# Patient Record
Sex: Female | Born: 1965 | Race: Black or African American | Hispanic: No | Marital: Single | Smoking: Current every day smoker
Health system: Southern US, Community
[De-identification: ages and names within clinical notes are randomized; demographics above are authoritative.]

## PROBLEM LIST (undated history)

## (undated) DIAGNOSIS — I10 Essential (primary) hypertension: Secondary | ICD-10-CM

## (undated) HISTORY — PX: TOTAL ABDOMINAL HYSTERECTOMY: SHX209

## (undated) HISTORY — PX: OTHER SURGICAL HISTORY: SHX169

## (undated) HISTORY — PX: ABDOMINAL HYSTERECTOMY: SHX81

## (undated) HISTORY — DX: Essential (primary) hypertension: I10

---

## 2016-04-10 NOTE — Congregational Nurse Program (Signed)
Congregational Nurse Program Note  Date of Encounter: 04/10/2016  Past Medical History: No past medical history on file.  Encounter Details:     CNP Questionnaire - 03/17/16 1730      Patient Demographics   Is this a new or existing patient? Existing   Patient is considered a/an Not Applicable   Race African-American/Black     Patient Assistance   Location of Patient Assistance Home of [address], [place]   Patient's financial/insurance status Orange Card/Care Connects   Uninsured Patient (Orange Card/Care Connects) Yes   Interventions Not Applicable   Patient referred to apply for the following financial assistance Not Applicable   Food insecurities addressed Not Applicable   Transportation assistance Yes   Type of Assistance [facility]AT   Assistance securing medications No   Educational health offerings Navigating the healthcare system     Encounter Details   Primary purpose of visit Navigating the Healthcare System;Chronic Illness/Condition Visit   Was an Emergency Department visit averted? Not Applicable   Does patient have a medical provider? No   Patient referred to Not Applicable   Was a mental health screening completed? (GAINS tool) No   Does patient have dental issues? No   Does patient have vision issues? No   Does your patient have an abnormal blood pressure today? No   Since previous encounter, have you referred patient for abnormal blood pressure that resulted in a new diagnosis or medication change? No   Does your patient have an abnormal blood glucose today? No   Since previous encounter, have you referred patient for abnormal blood glucose that resulted in a new diagnosis or medication change? No   Was there a life-saving intervention made? No    03/20/16 tct ADTS spoke with Waynetta Sandy, stated no transportation available on the 30th dt Good  Friday. Client stated she will ask some friends to take her.  Oswaldo Conroy, [place] [facility] [phone]

## 2016-04-10 NOTE — Congregational Nurse Program (Signed)
Congregational Nurse Program Note  Date of Encounter: 04/10/2016  Past Medical History: No past medical history on file.  Encounter Details:     CNP Questionnaire - 03/13/16 1745      Patient Demographics   Is this a new or existing patient? New   Patient is considered a/an Not Applicable   Race African-American/Black     Patient Assistance   Location of Patient Assistance Home of [address], [place]   Patient's financial/insurance status Orange Card/Care Connects   Uninsured Patient (Orange Card/Care Connects) Yes   Interventions Not Applicable   Patient referred to apply for the following financial assistance Medicaid   Food insecurities addressed Not Applicable   Transportation assistance Yes   Type of Network engineer medications No   Educational health offerings Navigating the healthcare system     Encounter Details   Primary purpose of visit Navigating the Healthcare System   Was an Emergency Department visit averted? Not Applicable   Does patient have a medical provider? No   Patient referred to Not Applicable   Was a mental health screening completed? (GAINS tool) No   Does patient have dental issues? No   Does patient have vision issues? No   Does your patient have an abnormal blood pressure today? No   Since previous encounter, have you referred patient for abnormal blood pressure that resulted in a new diagnosis or medication change? No   Does your patient have an abnormal blood glucose today? No   Since previous encounter, have you referred patient for abnormal blood glucose that resulted in a new diagnosis or medication change? No   Was there a life-saving intervention made? No    Has appointment to see [facility] on 04-07-16 Haevyn Ury RN, [place], [phone]  BP 137/92 [phone]

## 2016-04-10 NOTE — Congregational Nurse Program (Signed)
Congregational Nurse Program Note  Date of Encounter: 04/10/2016  Past Medical History: No past medical history on file.  Encounter Details:     CNP Questionnaire - 04/10/16 1730      Patient Demographics   Is this a new or existing patient? Existing   Patient is considered a/an Not Applicable   Race African-American/Black     Patient Assistance   Patient's financial/insurance status Orange Card/Care Connects   Interventions Appt. has been completed   Patient referred to apply for the following financial assistance Not Applicable   Food insecurities addressed Not Applicable   Transportation assistance No   Assistance securing medications No   Educational health offerings Other     Encounter Details   Primary purpose of visit Education/Health Concerns   Was an Emergency Department visit averted? Not Applicable   Does patient have a medical provider? No   Patient referred to Other   Was a mental health screening completed? (GAINS tool) No   Does patient have dental issues? No   Does patient have vision issues? No   Does your patient have an abnormal blood pressure today? No   Since previous encounter, have you referred patient for abnormal blood pressure that resulted in a new diagnosis or medication change? No   Does your patient have an abnormal blood glucose today? No   Since previous encounter, have you referred patient for abnormal blood glucose that resulted in a new diagnosis or medication change? No   Was there a life-saving intervention made? No     Seen at [facility] on 3-[facility]- ,told may have to have surgery to remove pin in left ankle.Office to schedule X-rays. Juliet Rude, [place] [phone]

## 2017-10-22 ENCOUNTER — Other Ambulatory Visit: Payer: Self-pay

## 2017-10-22 ENCOUNTER — Encounter (HOSPITAL_COMMUNITY): Payer: Self-pay

## 2017-10-22 ENCOUNTER — Emergency Department (HOSPITAL_COMMUNITY)
Admission: EM | Admit: 2017-10-22 | Discharge: 2017-10-22 | Disposition: A | Discharge status: Home / Self Care | Payer: Self-pay | Attending: Emergency Medicine | Admitting: Emergency Medicine

## 2017-10-22 ENCOUNTER — Emergency Department (HOSPITAL_COMMUNITY): Payer: Self-pay

## 2017-10-22 DIAGNOSIS — F17210 Nicotine dependence, cigarettes, uncomplicated: Secondary | ICD-10-CM | POA: Insufficient documentation

## 2017-10-22 DIAGNOSIS — M79672 Pain in left foot: Secondary | ICD-10-CM | POA: Insufficient documentation

## 2017-10-22 LAB — CBC WITH DIFFERENTIAL/PLATELET
Abs Immature Granulocytes: 0.01 10*3/uL (ref 0.00–0.07)
BASOS ABS: 0 10*3/uL (ref 0.0–0.1)
Basophils Relative: 1 %
EOS PCT: 3 %
Eosinophils Absolute: 0.2 10*3/uL (ref 0.0–0.5)
HCT: 48.2 % — ABNORMAL HIGH (ref 36.0–46.0)
HEMOGLOBIN: 15.3 g/dL — AB (ref 12.0–15.0)
IMMATURE GRANULOCYTES: 0 %
LYMPHS ABS: 2.9 10*3/uL (ref 0.7–4.0)
LYMPHS PCT: 46 %
MCH: 30.9 pg (ref 26.0–34.0)
MCHC: 31.7 g/dL (ref 30.0–36.0)
MCV: 97.4 fL (ref 80.0–100.0)
Monocytes Absolute: 0.3 10*3/uL (ref 0.1–1.0)
Monocytes Relative: 6 %
NEUTROS PCT: 44 %
NRBC: 0 % (ref 0.0–0.2)
Neutro Abs: 2.6 10*3/uL (ref 1.7–7.7)
Platelets: 256 10*3/uL (ref 150–400)
RBC: 4.95 MIL/uL (ref 3.87–5.11)
RDW: 13.1 % (ref 11.5–15.5)
WBC: 6 10*3/uL (ref 4.0–10.5)

## 2017-10-22 LAB — COMPREHENSIVE METABOLIC PANEL
ALBUMIN: 4.5 g/dL (ref 3.5–5.0)
ALK PHOS: 65 U/L (ref 38–126)
ALT: 17 U/L (ref 0–44)
AST: 20 U/L (ref 15–41)
Anion gap: 9 (ref 5–15)
BUN: 19 mg/dL (ref 6–20)
CALCIUM: 9.3 mg/dL (ref 8.9–10.3)
CO2: 24 mmol/L (ref 22–32)
CREATININE: 0.9 mg/dL (ref 0.44–1.00)
Chloride: 106 mmol/L (ref 98–111)
GFR calc Af Amer: >60 mL/min (ref >60)
GFR calc non Af Amer: >60 mL/min (ref >60)
GLUCOSE: 88 mg/dL (ref 70–99)
Potassium: 4 mmol/L (ref 3.5–5.1)
SODIUM: 139 mmol/L (ref 135–145)
Total Bilirubin: 0.7 mg/dL (ref 0.3–1.2)
Total Protein: 8 g/dL (ref 6.5–8.1)

## 2017-10-22 MED ORDER — SULFAMETHOXAZOLE-TRIMETHOPRIM 800-160 MG PO TABS: 1.0000 | ORAL_TABLET | Freq: Two times a day (BID) | ORAL | 0 refills | Status: AC

## 2017-10-22 MED ORDER — TRAMADOL HCL 50 MG PO TABS: 50.0000 mg | ORAL_TABLET | Freq: Four times a day (QID) | ORAL | 0 refills | Status: DC | PRN

## 2017-10-22 NOTE — Discharge Instructions (Addendum)
Follow-up with Dr. Romeo Apple or back to the health department the next couple weeks

## 2017-10-22 NOTE — ED Provider Notes (Signed)
[facility] EMERGENCY DEPARTMENT Provider Note   CSN: [id number] Arrival date & time: 10/22/17  1316     History   Chief Complaint Chief Complaint  Patient presents with  . Foot Pain    HPI Laura Golden is a 52 y.o. female.  Patient complains of pain in her left middle toe.  This is been going on for weeks and weeks.  She was seen by the health department initially  The history is provided by the patient. No language interpreter was used.  Foot Pain  This is a new problem. The current episode started more than 1 week ago. The problem occurs constantly. The problem has not changed since onset.Pertinent negatives include no chest pain, no abdominal pain and no headaches. Nothing aggravates the symptoms. Nothing relieves the symptoms. She has tried nothing for the symptoms. The treatment provided no relief.    History reviewed. No pertinent past medical history.  There are no active problems to display for this patient.   Past Surgical History:  Procedure Laterality Date  . foot sx       OB History   None      Home Medications    Prior to Admission medications   Medication Sig Start Date End Date Taking? Authorizing Provider  acetaminophen (TYLENOL) 325 MG tablet Take 325 mg by mouth every 6 (six) hours as needed for mild pain or headache.   Yes [provider]  ibuprofen (ADVIL,MOTRIN) 800 MG tablet Take 800 mg by mouth every 8 (eight) hours as needed for fever or moderate pain.   Yes [provider]  sulfamethoxazole-trimethoprim (BACTRIM DS,SEPTRA DS) 800-160 MG tablet Take 1 tablet by mouth 2 (two) times daily for 7 days. 10/22/17 10/29/17  Bethann Berkshire, MD  traMADol (ULTRAM) 50 MG tablet Take 1 tablet (50 mg total) by mouth every 6 (six) hours as needed for moderate pain. 10/22/17   Bethann Berkshire, MD    Family History No family history on file.  Social History Social History   Tobacco Use  . Smoking status: Current Every Day Smoker   Packs/day: 0.50    Types: Cigarettes  Substance Use Topics  . Alcohol use: Not Currently    Frequency: Never  . Drug use: Not Currently     Allergies   Penicillins   Review of Systems Review of Systems  Constitutional: Negative for appetite change and fatigue.  HENT: Negative for congestion, ear discharge and sinus pressure.   Eyes: Negative for discharge.  Respiratory: Negative for cough.   Cardiovascular: Negative for chest pain.  Gastrointestinal: Negative for abdominal pain and diarrhea.  Genitourinary: Negative for frequency and hematuria.  Musculoskeletal: Negative for back pain.       Left toe pain  Skin: Negative for rash.  Neurological: Negative for seizures and headaches.  Psychiatric/Behavioral: Negative for hallucinations.     Physical Exam Updated Vital Signs BP (!) 145/90 (BP Location: Right Arm)   Pulse 81   Temp 98.4 F (36.9 C) (Oral)   Resp 16   Wt 48.1 kg   SpO2 100%   Physical Exam  Constitutional: She is oriented to person, place, and time. She appears well-developed.  HENT:  Head: Normocephalic.  Eyes: Conjunctivae and EOM are normal. No scleral icterus.  Neck: Neck supple. No thyromegaly present.  Cardiovascular: Normal rate and regular rhythm. Exam reveals no gallop and no friction rub.  No murmur heard. Pulmonary/Chest: No stridor. She has no wheezes. She has no rales. She exhibits no tenderness.  Abdominal: She exhibits no distension. There is no tenderness. There is no rebound.  Musculoskeletal: Normal range of motion. She exhibits no edema.  Left middle toe dark discoloration and tenderness.  Dorsalis pedis 2+  Lymphadenopathy:    She has no cervical adenopathy.  Neurological: She is oriented to person, place, and time. She exhibits normal muscle tone. Coordination normal.  Skin: No rash noted. No erythema.  Psychiatric: She has a normal mood and affect. Her behavior is normal.     ED Treatments / Results  Labs (all labs  ordered are listed, but only abnormal results are displayed) Labs Reviewed  CBC WITH DIFFERENTIAL/PLATELET - Abnormal; Notable for the following components:      Result Value   Hemoglobin 15.3 (*)    HCT 48.2 (*)    All other components within normal limits  COMPREHENSIVE METABOLIC PANEL    EKG None  Radiology Dg Foot Complete Right  Result Date: 10/22/2017 CLINICAL DATA:  Chronic right foot pain.  No known injury. EXAM: RIGHT FOOT COMPLETE - 3+ VIEW COMPARISON:  None. FINDINGS: There is no evidence of fracture or dislocation. There is no evidence of arthropathy or other focal bone abnormality. Soft tissues are unremarkable. IMPRESSION: Normal exam. Electronically Signed   By: Drusilla Kanner M.D.   On: 10/22/2017 16:01    Procedures Procedures (including critical care time)  Medications Ordered in ED Medications - No data to display   Initial Impression / Assessment and Plan / ED Course  I have reviewed the triage vital signs and the nursing notes.  Pertinent labs & imaging results that were available during my care of the patient were reviewed by me and considered in my medical decision making (see chart for details).     Plain left middle toes.  Will treat empirically with antibiotics and referred to orthopedics Final Clinical Impressions(s) / ED Diagnoses   Final diagnoses:  Foot pain, left    ED Discharge Orders         Ordered    sulfamethoxazole-trimethoprim (BACTRIM DS,SEPTRA DS) 800-160 MG tablet  2 times daily     10/22/17 1634    traMADol (ULTRAM) 50 MG tablet  Every 6 hours PRN     10/22/17 1634           Bethann Berkshire, MD 10/22/17 1645

## 2017-10-22 NOTE — ED Triage Notes (Addendum)
Pt reports that both feet have been hurting for years. Pt reports that toe nails on both feet have become dark colored for over a year. Pt states toes are sore. States ibuprofen doesn't help.Family wants to ensure she doesn't have gangrene

## 2017-10-30 ENCOUNTER — Telehealth: Payer: Self-pay | Admitting: Orthopaedic Surgery

## 2017-10-30 NOTE — Telephone Encounter (Signed)
Patient called, states had been seen at [facility] Emergency room 10/22/17 for right foot pain; had Xray. York Spaniel was told can see primary care or orthopaedics; said has no primary care, so asking about scheduling orthopaedic appointment. Discussed selfpay protocol, as states has no insurance.  Patient also mentioned that she had surgery by Dr Ulice Brilliant, podiatrist, several years ago on this foot. Will go ahead and try calling podiatry; to call back if needs to schedule here.

## 2018-11-20 ENCOUNTER — Other Ambulatory Visit: Payer: Self-pay | Admitting: *Deleted

## 2018-11-20 DIAGNOSIS — Z20822 Contact with and (suspected) exposure to covid-19: Secondary | ICD-10-CM

## 2018-11-22 LAB — NOVEL CORONAVIRUS, NAA: SARS-CoV-2, NAA: NOT DETECTED

## 2018-11-25 ENCOUNTER — Telehealth: Payer: Self-pay | Admitting: *Deleted

## 2018-11-25 NOTE — Telephone Encounter (Signed)
Patient notified of negative COVID result- patient tested due to exposure. Patient advised continue safe practices, contact PCP for symptoms and get flu shot this season.

## 2019-04-23 ENCOUNTER — Other Ambulatory Visit: Payer: Self-pay

## 2019-04-23 ENCOUNTER — Ambulatory Visit (INDEPENDENT_AMBULATORY_CARE_PROVIDER_SITE_OTHER): Payer: Self-pay | Admitting: Family Medicine

## 2019-04-23 ENCOUNTER — Encounter: Payer: Self-pay | Admitting: Family Medicine

## 2019-04-23 VITALS — BP 138/93 | HR 89 | Temp 97.8°F | Ht 59.0 in | Wt 110.0 lb

## 2019-04-23 DIAGNOSIS — R21 Rash and other nonspecific skin eruption: Secondary | ICD-10-CM

## 2019-04-23 DIAGNOSIS — Z0000 Encounter for general adult medical examination without abnormal findings: Secondary | ICD-10-CM

## 2019-04-23 DIAGNOSIS — W57XXXA Bitten or stung by nonvenomous insect and other nonvenomous arthropods, initial encounter: Secondary | ICD-10-CM

## 2019-04-23 MED ORDER — TRIAMCINOLONE ACETONIDE 0.1 % EX CREA: 1.0000 "application " | TOPICAL_CREAM | Freq: Two times a day (BID) | CUTANEOUS | 1 refills | Status: DC

## 2019-04-23 NOTE — Progress Notes (Signed)
New Patient Office Visit  Assessment & Plan:  1. Bug bite, initial encounter - Advised to have an exterminator come out to her home and treat for bedbugs.  Discussed if she does not treat the problem her rash is never going to completely go away as she is just going to treat old bites and then new ones are going to come home.  Education provided on rashes.  Encouraged to take either Zyrtec or Xyzal at bedtime to help with itching. - triamcinolone cream (KENALOG) 0.1 %; Apply 1 application topically 2 (two) times daily.  Dispense: 80 g; Refill: 1  2. Healthcare maintenance - Patient declined HIV screening, tetanus, and colonoscopy.  She will schedule her mammogram on the bus that comes to our office.   Follow-up: Return as soon as she is agreeable, for annual physical.   Deliah Boston, MSN, APRN, FNP-C [facility]  Subjective:  Patient ID: Laura Golden, female    DOB: 05/21/1965  Age: 54 y.o. MRN: [id number]  Patient Care Team: Gwenlyn Fudge, FNP as PCP - General (Family Medicine)  CC:  Chief Complaint  Patient presents with  . New Patient (Initial Visit)    Dr. Charm Barges  . Establish Care  . Rash    bilateral arms- x 3 months    HPI Laura Golden presents to establish care. Patient is transferring care from Dr. Charm Barges.  Patient is concerned about an itchy rash on both arms for the past 3 months.  She has tried changing her laundry detergent which has not been effective.  She has been applying a clear antiitch lotion and antiitch cream.  She has been taking Benadryl.  She does report seeing a bedbug at the head of her bed recently.   Review of Systems  Constitutional: Negative for chills, fever, malaise/fatigue and weight loss.  HENT: Negative for congestion, ear discharge, ear pain, nosebleeds, sinus pain, sore throat and tinnitus.   Eyes: Negative for blurred vision, double vision, pain, discharge and redness.  Respiratory: Negative for cough,  shortness of breath and wheezing.   Cardiovascular: Negative for chest pain, palpitations and leg swelling.  Gastrointestinal: Negative for abdominal pain, constipation, diarrhea, heartburn, nausea and vomiting.  Genitourinary: Negative for dysuria, frequency and urgency.  Musculoskeletal: Negative for myalgias.  Skin: Positive for itching and rash.  Neurological: Negative for dizziness, seizures, weakness and headaches.  Psychiatric/Behavioral: Negative for depression, substance abuse and suicidal ideas. The patient is not nervous/anxious.     Current Outpatient Medications:  .  acetaminophen (TYLENOL) 325 MG tablet, Take 325 mg by mouth every 6 (six) hours as needed for mild pain or headache., Disp: , Rfl:  .  ibuprofen (ADVIL,MOTRIN) 800 MG tablet, Take 800 mg by mouth every 8 (eight) hours as needed for fever or moderate pain., Disp: , Rfl:  .  triamcinolone cream (KENALOG) 0.1 %, Apply 1 application topically 2 (two) times daily., Disp: 80 g, Rfl: 1  Allergies  Allergen Reactions  . Penicillins     History reviewed. No pertinent past medical history.  Past Surgical History:  Procedure Laterality Date  . ABDOMINAL HYSTERECTOMY    . foot sx      Family History  Problem Relation Age of Onset  . Diabetes Mother   . Heart disease Mother   . Hypertension Mother   . Liver disease Father   . Hypertension Sister   . Hyperlipidemia Brother   . Asthma Daughter   . Allergies Daughter  Social History   Socioeconomic History  . Marital status: Single    Spouse name: Not on file  . Number of children: Not on file  . Years of education: Not on file  . Highest education level: Not on file  Occupational History  . Not on file  Tobacco Use  . Smoking status: Current Every Day Smoker    Packs/day: 0.25    Types: Cigarettes  . Smokeless tobacco: Never Used  Substance and Sexual Activity  . Alcohol use: Yes    Comment: occ  . Drug use: Never  . Sexual activity: Not  Currently  Other Topics Concern  . Not on file  Social History Narrative  . Not on file   Social Determinants of Health   Financial Resource Strain:   . Difficulty of Paying Living Expenses:   Food Insecurity:   . Worried About Charity fundraiser in the Last Year:   . Arboriculturist in the Last Year:   Transportation Needs:   . Film/video editor (Medical):   Marland Kitchen Lack of Transportation (Non-Medical):   Physical Activity:   . Days of Exercise per Week:   . Minutes of Exercise per Session:   Stress:   . Feeling of Stress :   Social Connections:   . Frequency of Communication with Friends and Family:   . Frequency of Social Gatherings with Friends and Family:   . Attends Religious Services:   . Active Member of Clubs or Organizations:   . Attends Archivist Meetings:   Marland Kitchen Marital Status:   Intimate Partner Violence:   . Fear of Current or Ex-Partner:   . Emotionally Abused:   Marland Kitchen Physically Abused:   . Sexually Abused:     Objective:   Today's Vitals: BP (!) 138/93   Pulse 89   Temp 97.8 F (36.6 C) (Temporal)   Ht 4\' 11"  (1.499 m)   Wt 110 lb (49.9 kg)   SpO2 95%   BMI 22.22 kg/m   Physical Exam Vitals reviewed.  Constitutional:      General: She is not in acute distress.    Appearance: Normal appearance. She is normal weight. She is not ill-appearing, toxic-appearing or diaphoretic.  HENT:     Head: Normocephalic and atraumatic.  Eyes:     General: No scleral icterus.       Right eye: No discharge.        Left eye: No discharge.     Conjunctiva/sclera: Conjunctivae normal.  Cardiovascular:     Rate and Rhythm: Normal rate and regular rhythm.     Heart sounds: Normal heart sounds. No murmur. No friction rub. No gallop.   Pulmonary:     Effort: Pulmonary effort is normal. No respiratory distress.     Breath sounds: Normal breath sounds. No stridor. No wheezing, rhonchi or rales.  Musculoskeletal:        General: Normal range of motion.      Cervical back: Normal range of motion.  Skin:    General: Skin is warm and dry.     Capillary Refill: Capillary refill takes less than 2 seconds.     Findings: Rash present. Rash is papular (bilateral arms and back of neck).  Neurological:     General: No focal deficit present.     Mental Status: She is alert and oriented to person, place, and time. Mental status is at baseline.  Psychiatric:        Mood and  Affect: Mood normal.        Behavior: Behavior normal.        Thought Content: Thought content normal.        Judgment: Judgment normal.

## 2019-04-23 NOTE — Patient Instructions (Addendum)
Zyrtec or Xyzal at night to help with itching.   Rash, Adult  A rash is a change in the color of your skin. A rash can also change the way your skin feels. There are many different conditions and factors that can cause a rash. Follow these instructions at home: The goal of treatment is to stop the itching and keep the rash from spreading. Watch for any changes in your symptoms. Let your doctor know about them. Follow these instructions to help with your condition: Medicine Take or apply over-the-counter and prescription medicines only as told by your doctor. These may include medicines:  To treat red or swollen skin (corticosteroid creams).  To treat itching.  To treat an allergy (oral antihistamines).  To treat very bad symptoms (oral corticosteroids).  Skin care  Put cool cloths (compresses) on the affected areas.  Do not scratch or rub your skin.  Avoid covering the rash. Make sure that the rash is exposed to air as much as possible. Managing itching and discomfort  Avoid hot showers or baths. These can make itching worse. A cold shower may help.  Try taking a bath with: ? Epsom salts. You can get these at your local pharmacy or grocery store. Follow the instructions on the package. ? Baking soda. Pour a small amount into the bath as told by your doctor. ? Colloidal oatmeal. You can get this at your local pharmacy or grocery store. Follow the instructions on the package.  Try putting baking soda paste onto your skin. Stir water into baking soda until it gets like a paste.  Try putting on a lotion that relieves itchiness (calamine lotion).  Keep cool and out of the sun. Sweating and being hot can make itching worse. General instructions   Rest as needed.  Drink enough fluid to keep your pee (urine) pale yellow.  Wear loose-fitting clothing.  Avoid scented soaps, detergents, and perfumes. Use gentle soaps, detergents, perfumes, and other cosmetic products.  Avoid  anything that causes your rash. Keep a journal to help track what causes your rash. Write down: ? What you eat. ? What cosmetic products you use. ? What you drink. ? What you wear. This includes jewelry.  Keep all follow-up visits as told by your doctor. This is important. Contact a doctor if:  You sweat at night.  You lose weight.  You pee (urinate) more than normal.  You pee less than normal, or you notice that your pee is a darker color than normal.  You feel weak.  You throw up (vomit).  Your skin or the whites of your eyes look yellow (jaundice).  Your skin: ? Tingles. ? Is numb.  Your rash: ? Does not go away after a few days. ? Gets worse.  You are: ? More thirsty than normal. ? More tired than normal.  You have: ? New symptoms. ? Pain in your belly (abdomen). ? A fever. ? Watery poop (diarrhea). Get help right away if:  You have a fever and your symptoms suddenly get worse.  You start to feel mixed up (confused).  You have a very bad headache or a stiff neck.  You have very bad joint pains or stiffness.  You have jerky movements that you cannot control (seizure).  Your rash covers all or most of your body. The rash may or may not be painful.  You have blisters that: ? Are on top of the rash. ? Grow larger. ? Grow together. ? Are painful. ?  Are inside your nose or mouth.  You have a rash that: ? Looks like purple pinprick-sized spots all over your body. ? Has a "bull's eye" or looks like a target. ? Is red and painful, causes your skin to peel, and is not from being in the sun too long. Summary  A rash is a change in the color of your skin. A rash can also change the way your skin feels.  The goal of treatment is to stop the itching and keep the rash from spreading.  Take or apply over-the-counter and prescription medicines only as told by your doctor.  Contact a doctor if you have new symptoms or symptoms that get worse.  Keep all  follow-up visits as told by your doctor. This is important. This information is not intended to replace advice given to you by your health care provider. Make sure you discuss any questions you have with your health care provider. Document Revised: 04/19/2018 Document Reviewed: 07/30/2017 Elsevier Patient Education  2020 [employer].

## 2019-05-29 ENCOUNTER — Ambulatory Visit: Payer: Medicaid Other | Admitting: Family Medicine

## 2019-06-11 ENCOUNTER — Other Ambulatory Visit: Payer: Self-pay | Admitting: Family Medicine

## 2019-06-11 DIAGNOSIS — W57XXXA Bitten or stung by nonvenomous insect and other nonvenomous arthropods, initial encounter: Secondary | ICD-10-CM

## 2019-07-22 ENCOUNTER — Other Ambulatory Visit: Payer: Self-pay

## 2019-07-22 ENCOUNTER — Encounter: Payer: Self-pay | Admitting: Family Medicine

## 2019-07-22 ENCOUNTER — Ambulatory Visit (INDEPENDENT_AMBULATORY_CARE_PROVIDER_SITE_OTHER): Payer: Medicaid Other | Admitting: Family Medicine

## 2019-07-22 VITALS — BP 160/69 | HR 84 | Temp 97.9°F | Ht 59.0 in | Wt 109.8 lb

## 2019-07-22 DIAGNOSIS — W57XXXA Bitten or stung by nonvenomous insect and other nonvenomous arthropods, initial encounter: Secondary | ICD-10-CM

## 2019-07-22 DIAGNOSIS — Z0000 Encounter for general adult medical examination without abnormal findings: Secondary | ICD-10-CM | POA: Diagnosis not present

## 2019-07-22 DIAGNOSIS — I10 Essential (primary) hypertension: Secondary | ICD-10-CM

## 2019-07-22 MED ORDER — TRIAMCINOLONE ACETONIDE 0.1 % EX CREA: TOPICAL_CREAM | Freq: Two times a day (BID) | CUTANEOUS | 0 refills | Status: DC

## 2019-07-22 MED ORDER — LISINOPRIL 10 MG PO TABS: 10.0000 mg | ORAL_TABLET | Freq: Every day | ORAL | 2 refills | Status: DC

## 2019-07-22 NOTE — Progress Notes (Signed)
Assessment & Plan:  1. Well adult exam - Preventive health education provided.  Patient declined colonoscopy, Tdap, Shingrix, HIV and hepatitis screening.  Unable to schedule mammogram at this time due to her insurance.  2. Essential hypertension - Uncontrolled.  Patient started on lisinopril 10 mg once daily.  Education provided on the DASH diet.  Encouraged to monitor her blood pressure at home an hour after she takes her medication and keep a log.  Patient to work on her diet and increase exercise. - lisinopril (ZESTRIL) 10 MG tablet; Take 1 tablet (10 mg total) by mouth daily.  Dispense: 30 tablet; Refill: 2  3. Bug bite, initial encounter - triamcinolone cream (KENALOG) 0.1 %; Apply topically 2 (two) times daily.  Dispense: 60 g; Refill: 0   Follow-up: Return in about 8 weeks (around 09/16/2019) for HTN.   Deliah Boston, MSN, APRN, FNP-C [facility]  Subjective:  Patient ID: Laura Golden, female    DOB: 01-02-1966  Age: 54 y.o. MRN: [id number]  Patient Care Team: Gwenlyn Fudge, FNP as PCP - General (Family Medicine)   CC:  Chief Complaint  Patient presents with  . Annual Exam    HPI [facility] presents for her annual physical.   Occupation: applying for disability, Marital status: single, Substance use: none Diet: Regular, Exercise: None Last eye exam: Never Last dental exam: Never Last colonoscopy: Never Last mammogram: Never Last pap smear: no longer needed due to complete hysterectomy Hepatitis C Screening: Never Immunizations:  Tdap Vaccine: declined  Shingrix Vaccine: declined  COVID-19 Vaccine: up to date  DEPRESSION SCREENING PHQ 2/9 Scores 07/22/2019 04/23/2019  PHQ - 2 Score 0 0    Review of Systems  Constitutional: Negative for chills, fever, malaise/fatigue and weight loss.  HENT: Negative for congestion, ear discharge, ear pain, nosebleeds, sinus pain, sore throat and tinnitus.   Eyes: Negative for blurred vision, double  vision, pain, discharge and redness.       Seeing black dots.  Respiratory: Negative for cough, shortness of breath and wheezing.   Cardiovascular: Negative for chest pain, palpitations and leg swelling.  Gastrointestinal: Negative for abdominal pain, constipation, diarrhea, heartburn, nausea and vomiting.  Genitourinary: Negative for dysuria, frequency and urgency.  Musculoskeletal: Negative for myalgias.  Skin: Negative for rash.  Neurological: Positive for headaches (today, but BP is elevated). Negative for dizziness, seizures and weakness.  Psychiatric/Behavioral: Negative for depression, substance abuse and suicidal ideas. The patient is not nervous/anxious.     Current Outpatient Medications:  .  acetaminophen (TYLENOL) 325 MG tablet, Take 325 mg by mouth every 6 (six) hours as needed for mild pain or headache., Disp: , Rfl:  .  ibuprofen (ADVIL,MOTRIN) 800 MG tablet, Take 800 mg by mouth every 8 (eight) hours as needed for fever or moderate pain., Disp: , Rfl:  .  triamcinolone cream (KENALOG) 0.1 %, APPLY TWICE DAILY, Disp: 60 g, Rfl: 0  Allergies  Allergen Reactions  . Penicillins     History reviewed. No pertinent past medical history.  Past Surgical History:  Procedure Laterality Date  . ABDOMINAL HYSTERECTOMY    . foot sx      Family History  Problem Relation Age of Onset  . Diabetes Mother   . Heart disease Mother   . Hypertension Mother   . Liver disease Father   . Hypertension Sister   . Hyperlipidemia Brother   . Asthma Daughter   . Allergies Daughter     Social History  Socioeconomic History  . Marital status: Single    Spouse name: Not on file  . Number of children: Not on file  . Years of education: Not on file  . Highest education level: Not on file  Occupational History  . Not on file  Tobacco Use  . Smoking status: Current Every Day Smoker    Packs/day: 0.25    Types: Cigarettes    Start date: 07/21/1981  . Smokeless tobacco: Never Used   Vaping Use  . Vaping Use: Never used  Substance and Sexual Activity  . Alcohol use: Yes    Comment: occ  . Drug use: Never  . Sexual activity: Not Currently  Other Topics Concern  . Not on file  Social History Narrative  . Not on file   Social Determinants of Health   Financial Resource Strain:   . Difficulty of Paying Living Expenses:   Food Insecurity:   . Worried About Programme researcher, broadcasting/film/video in the Last Year:   . Barista in the Last Year:   Transportation Needs:   . Freight forwarder (Medical):   Marland Kitchen Lack of Transportation (Non-Medical):   Physical Activity:   . Days of Exercise per Week:   . Minutes of Exercise per Session:   Stress:   . Feeling of Stress :   Social Connections:   . Frequency of Communication with Friends and Family:   . Frequency of Social Gatherings with Friends and Family:   . Attends Religious Services:   . Active Member of Clubs or Organizations:   . Attends Banker Meetings:   Marland Kitchen Marital Status:   Intimate Partner Violence:   . Fear of Current or Ex-Partner:   . Emotionally Abused:   Marland Kitchen Physically Abused:   . Sexually Abused:       Objective:    BP (!) 160/69   Pulse 84   Temp 97.9 F (36.6 C) (Temporal)   Ht 4\' 11"  (1.499 m)   Wt 109 lb 12.8 oz (49.8 kg)   SpO2 96%   BMI 22.18 kg/m   Wt Readings from Last 3 Encounters:  07/22/19 109 lb 12.8 oz (49.8 kg)  04/23/19 110 lb (49.9 kg)  10/22/17 106 lb (48.1 kg)    Physical Exam Vitals reviewed. Exam conducted with a chaperone present.  Constitutional:      General: She is not in acute distress.    Appearance: Normal appearance. She is normal weight. She is not ill-appearing, toxic-appearing or diaphoretic.  HENT:     Head: Normocephalic and atraumatic.     Right Ear: Tympanic membrane, ear canal and external ear normal. There is no impacted cerumen.     Left Ear: Tympanic membrane, ear canal and external ear normal. There is no impacted cerumen.     Nose:  Nose normal. No congestion or rhinorrhea.     Mouth/Throat:     Mouth: Mucous membranes are moist.     Pharynx: Oropharynx is clear. No oropharyngeal exudate or posterior oropharyngeal erythema.  Eyes:     General: No scleral icterus.       Right eye: No discharge.        Left eye: No discharge.     Conjunctiva/sclera: Conjunctivae normal.     Pupils: Pupils are equal, round, and reactive to light.  Cardiovascular:     Rate and Rhythm: Normal rate and regular rhythm.     Heart sounds: Normal heart sounds. No murmur heard.  No  friction rub. No gallop.   Pulmonary:     Effort: Pulmonary effort is normal. No respiratory distress.     Breath sounds: Normal breath sounds. No stridor. No wheezing, rhonchi or rales.  Chest:     Breasts: Breasts are symmetrical.        Right: Normal.        Left: Normal.  Abdominal:     General: Abdomen is flat. Bowel sounds are normal. There is no distension.     Palpations: Abdomen is soft. There is no mass.     Tenderness: There is no abdominal tenderness. There is no guarding or rebound.     Hernia: No hernia is present.  Musculoskeletal:        General: Normal range of motion.     Cervical back: Normal range of motion and neck supple. No rigidity. No muscular tenderness.  Lymphadenopathy:     Cervical: No cervical adenopathy.     Upper Body:     Right upper body: No supraclavicular, axillary or pectoral adenopathy.     Left upper body: No supraclavicular, axillary or pectoral adenopathy.  Skin:    General: Skin is warm and dry.     Capillary Refill: Capillary refill takes less than 2 seconds.     Findings: Rash present. Rash is papular (BUE).  Neurological:     General: No focal deficit present.     Mental Status: She is alert and oriented to person, place, and time. Mental status is at baseline.  Psychiatric:        Mood and Affect: Mood normal.        Behavior: Behavior normal.        Thought Content: Thought content normal.         Judgment: Judgment normal.     No results found for: TSH Lab Results  Component Value Date   WBC 6.0 10/22/2017   HGB 15.3 (H) 10/22/2017   HCT 48.2 (H) 10/22/2017   MCV 97.4 10/22/2017   PLT 256 10/22/2017   Lab Results  Component Value Date   NA 139 10/22/2017   K 4.0 10/22/2017   CO2 24 10/22/2017   GLUCOSE 88 10/22/2017   BUN 19 10/22/2017   CREATININE 0.90 10/22/2017   BILITOT 0.7 10/22/2017   ALKPHOS 65 10/22/2017   AST 20 10/22/2017   ALT 17 10/22/2017   PROT 8.0 10/22/2017   ALBUMIN 4.5 10/22/2017   CALCIUM 9.3 10/22/2017   ANIONGAP 9 10/22/2017   No results found for: CHOL No results found for: HDL No results found for: LDLCALC No results found for: TRIG No results found for: CHOLHDL No results found for: YBWL8L

## 2019-07-22 NOTE — Patient Instructions (Addendum)
Start checking your blood pressure at home an hour after you take the new medication.  Work on [employer]. Exercise more.  Ocean nasal spray.  Schedule eye exam.    DASH Eating Plan DASH stands for "Dietary Approaches to Stop Hypertension." The DASH eating plan is a healthy eating plan that has been shown to reduce high blood pressure (hypertension). It may also reduce your risk for type 2 diabetes, heart disease, and stroke. The DASH eating plan may also help with weight loss. What are tips for following this plan?  General guidelines  Avoid eating more than 2,300 mg (milligrams) of salt (sodium) a day. If you have hypertension, you may need to reduce your sodium intake to 1,500 mg a day.  Limit alcohol intake to no more than 1 drink a day for nonpregnant women and 2 drinks a day for men. One drink equals 12 oz of beer, 5 oz of wine, or 1 oz of hard liquor.  Work with your health care provider to maintain a healthy body weight or to lose weight. Ask what an ideal weight is for you.  Get at least 30 minutes of exercise that causes your heart to beat faster (aerobic exercise) most days of the week. Activities may include walking, swimming, or biking.  Work with your health care provider or diet and nutrition specialist (dietitian) to adjust your eating plan to your individual calorie needs. Reading food labels   Check food labels for the amount of sodium per serving. Choose foods with less than 5 percent of the Daily Value of sodium. Generally, foods with less than 300 mg of sodium per serving fit into this eating plan.  To find whole grains, look for the word "whole" as the first word in the ingredient list. Shopping  Buy products labeled as "low-sodium" or "no salt added."  Buy fresh foods. Avoid canned foods and premade or frozen meals. Cooking  Avoid adding salt when cooking. Use salt-free seasonings or herbs instead of table salt or sea salt. Check with your health care  provider or pharmacist before using salt substitutes.  Do not fry foods. Cook foods using healthy methods such as baking, boiling, grilling, and broiling instead.  Cook with heart-healthy oils, such as olive, canola, soybean, or sunflower oil. Meal planning  Eat a balanced diet that includes: ? 5 or more servings of fruits and vegetables each day. At each meal, try to fill half of your plate with fruits and vegetables. ? Up to 6-8 servings of whole grains each day. ? Less than 6 oz of lean meat, poultry, or fish each day. A 3-oz serving of meat is about the same size as a deck of cards. One egg equals 1 oz. ? 2 servings of low-fat dairy each day. ? A serving of nuts, seeds, or beans 5 times each week. ? Heart-healthy fats. Healthy fats called Omega-3 fatty acids are found in foods such as flaxseeds and coldwater fish, like sardines, salmon, and mackerel.  Limit how much you eat of the following: ? Canned or prepackaged foods. ? Food that is high in trans fat, such as fried foods. ? Food that is high in saturated fat, such as fatty meat. ? Sweets, desserts, sugary drinks, and other foods with added sugar. ? Full-fat dairy products.  Do not salt foods before eating.  Try to eat at least 2 vegetarian meals each week.  Eat more home-cooked food and less restaurant, buffet, and fast food.  When eating at a  restaurant, ask that your food be prepared with less salt or no salt, if possible. What foods are recommended? The items listed may not be a complete list. Talk with your dietitian about what dietary choices are best for you. Grains Whole-grain or whole-wheat bread. Whole-grain or whole-wheat pasta. Brown rice. [place]. Bulgur. Whole-grain and low-sodium cereals. Pita bread. Low-fat, low-sodium crackers. Whole-wheat flour tortillas. Vegetables Fresh or frozen vegetables (raw, steamed, roasted, or grilled). Low-sodium or reduced-sodium tomato and vegetable juice. Low-sodium or  reduced-sodium tomato sauce and tomato paste. Low-sodium or reduced-sodium canned vegetables. Fruits All fresh, dried, or frozen fruit. Canned fruit in natural juice (without added sugar). Meat and other protein foods Skinless chicken or [place]. Ground chicken or [place]. Pork with fat trimmed off. Fish and seafood. Egg whites. Dried beans, peas, or lentils. Unsalted nuts, nut butters, and seeds. Unsalted canned beans. Lean cuts of beef with fat trimmed off. Low-sodium, lean deli meat. Dairy Low-fat (1%) or fat-free (skim) milk. Fat-free, low-fat, or reduced-fat cheeses. Nonfat, low-sodium ricotta or cottage cheese. Low-fat or nonfat yogurt. Low-fat, low-sodium cheese. Fats and oils Soft margarine without trans fats. Vegetable oil. Low-fat, reduced-fat, or light mayonnaise and salad dressings (reduced-sodium). Canola, safflower, olive, soybean, and sunflower oils. Avocado. Seasoning and other foods Herbs. Spices. Seasoning mixes without salt. Unsalted popcorn and pretzels. Fat-free sweets. What foods are not recommended? The items listed may not be a complete list. Talk with your dietitian about what dietary choices are best for you. Grains Baked goods made with fat, such as croissants, muffins, or some breads. Dry pasta or rice meal packs. Vegetables Creamed or fried vegetables. Vegetables in a cheese sauce. Regular canned vegetables (not low-sodium or reduced-sodium). Regular canned tomato sauce and paste (not low-sodium or reduced-sodium). Regular tomato and vegetable juice (not low-sodium or reduced-sodium). Angie Fava. Olives. Fruits Canned fruit in a light or heavy syrup. Fried fruit. Fruit in cream or butter sauce. Meat and other protein foods Fatty cuts of meat. Ribs. Fried meat. Berniece Salines. Sausage. Bologna and other processed lunch meats. Salami. Fatback. Hotdogs. Bratwurst. Salted nuts and seeds. Canned beans with added salt. Canned or smoked fish. Whole eggs or egg yolks. Chicken or [place]  with skin. Dairy Whole or 2% milk, cream, and half-and-half. Whole or full-fat cream cheese. Whole-fat or sweetened yogurt. Full-fat cheese. Nondairy creamers. Whipped toppings. Processed cheese and cheese spreads. Fats and oils Butter. Stick margarine. Lard. Shortening. Ghee. Bacon fat. Tropical oils, such as coconut, palm kernel, or palm oil. Seasoning and other foods Salted popcorn and pretzels. Onion salt, garlic salt, seasoned salt, table salt, and sea salt. Worcestershire sauce. Tartar sauce. Barbecue sauce. Teriyaki sauce. Soy sauce, including reduced-sodium. Steak sauce. Canned and packaged gravies. Fish sauce. Oyster sauce. Cocktail sauce. Horseradish that you find on the shelf. Ketchup. Mustard. Meat flavorings and tenderizers. Bouillon cubes. Hot sauce and Tabasco sauce. Premade or packaged marinades. Premade or packaged taco seasonings. Relishes. Regular salad dressings. Where to find more information:  National Heart, Lung, and [place]: https://wilson-eaton.com/  American Heart Association: www.heart.org Summary  The DASH eating plan is a healthy eating plan that has been shown to reduce high blood pressure (hypertension). It may also reduce your risk for type 2 diabetes, heart disease, and stroke.  With the DASH eating plan, you should limit salt (sodium) intake to 2,300 mg a day. If you have hypertension, you may need to reduce your sodium intake to 1,500 mg a day.  When on the DASH eating plan, aim to eat more  fresh fruits and vegetables, whole grains, lean proteins, low-fat dairy, and heart-healthy fats.  Work with your health care provider or diet and nutrition specialist (dietitian) to adjust your eating plan to your individual calorie needs. This information is not intended to replace advice given to you by your health care provider. Make sure you discuss any questions you have with your health care provider. Document Revised: 12/08/2016 Document Reviewed:  12/20/2015 Elsevier Patient Education  2020 Elsevier Inc.   Preventive Care 71-38 Years Old, Female Preventive care refers to visits with your health care provider and lifestyle choices that can promote health and wellness. This includes:  A yearly physical exam. This may also be called an annual well check.  Regular dental visits and eye exams.  Immunizations.  Screening for certain conditions.  Healthy lifestyle choices, such as eating a healthy diet, getting regular exercise, not using drugs or products that contain nicotine and tobacco, and limiting alcohol use. What can I expect for my preventive care visit? Physical exam Your health care provider will check your:  Height and weight. This may be used to calculate body mass index (BMI), which tells if you are at a healthy weight.  Heart rate and blood pressure.  Skin for abnormal spots. Counseling Your health care provider may ask you questions about your:  Alcohol, tobacco, and drug use.  Emotional well-being.  Home and relationship well-being.  Sexual activity.  Eating habits.  Work and work Statistician.  Method of birth control.  Menstrual cycle.  Pregnancy history. What immunizations do I need?  Influenza (flu) vaccine  This is recommended every year. Tetanus, diphtheria, and pertussis (Tdap) vaccine  You may need a Td booster every 10 years. Varicella (chickenpox) vaccine  You may need this if you have not been vaccinated. Zoster (shingles) vaccine  You may need this after age 79. Measles, mumps, and rubella (MMR) vaccine  You may need at least one dose of MMR if you were born in 1957 or later. You may also need a second dose. Pneumococcal conjugate (PCV13) vaccine  You may need this if you have certain conditions and were not previously vaccinated. Pneumococcal polysaccharide (PPSV23) vaccine  You may need one or two doses if you smoke cigarettes or if you have certain  conditions. Meningococcal conjugate (MenACWY) vaccine  You may need this if you have certain conditions. Hepatitis A vaccine  You may need this if you have certain conditions or if you travel or work in places where you may be exposed to hepatitis A. Hepatitis B vaccine  You may need this if you have certain conditions or if you travel or work in places where you may be exposed to hepatitis B. Haemophilus influenzae type b (Hib) vaccine  You may need this if you have certain conditions. Human papillomavirus (HPV) vaccine  If recommended by your health care provider, you may need three doses over 6 months. You may receive vaccines as individual doses or as more than one vaccine together in one shot (combination vaccines). Talk with your health care provider about the risks and benefits of combination vaccines. What tests do I need? Blood tests  Lipid and cholesterol levels. These may be checked every 5 years, or more frequently if you are over 21 years old.  Hepatitis C test.  Hepatitis B test. Screening  Lung cancer screening. You may have this screening every year starting at age 64 if you have a 30-pack-year history of smoking and currently smoke or have quit within  the past 15 years.  Colorectal cancer screening. All adults should have this screening starting at age 68 and continuing until age 31. Your health care provider may recommend screening at age 105 if you are at increased risk. You will have tests every 1-10 years, depending on your results and the type of screening test.  Diabetes screening. This is done by checking your blood sugar (glucose) after you have not eaten for a while (fasting). You may have this done every 1-3 years.  Mammogram. This may be done every 1-2 years. Talk with your health care provider about when you should start having regular mammograms. This may depend on whether you have a family history of breast cancer.  BRCA-related cancer screening. This  may be done if you have a family history of breast, ovarian, tubal, or peritoneal cancers.  Pelvic exam and Pap test. This may be done every 3 years starting at age 87. Starting at age 24, this may be done every 5 years if you have a Pap test in combination with an HPV test. Other tests  Sexually transmitted disease (STD) testing.  Bone density scan. This is done to screen for osteoporosis. You may have this scan if you are at high risk for osteoporosis. Follow these instructions at home: Eating and drinking  Eat a diet that includes fresh fruits and vegetables, whole grains, lean protein, and low-fat dairy.  Take vitamin and mineral supplements as recommended by your health care provider.  Do not drink alcohol if: ? Your health care provider tells you not to drink. ? You are pregnant, may be pregnant, or are planning to become pregnant.  If you drink alcohol: ? Limit how much you have to 0-1 drink a day. ? Be aware of how much alcohol is in your drink. In the [place] one drink equals one 12 oz bottle of beer (355 mL), one 5 oz glass of wine (148 mL), or one 1 oz glass of hard liquor (44 mL). Lifestyle  Take daily care of your teeth and gums.  Stay active. Exercise for at least 30 minutes on 5 or more days each week.  Do not use any products that contain nicotine or tobacco, such as cigarettes, e-cigarettes, and chewing tobacco. If you need help quitting, ask your health care provider.  If you are sexually active, practice safe sex. Use a condom or other form of birth control (contraception) in order to prevent pregnancy and STIs (sexually transmitted infections).  If told by your health care provider, take low-dose aspirin daily starting at age 63. What's next?  Visit your health care provider once a year for a well check visit.  Ask your health care provider how often you should have your eyes and teeth checked.  Stay up to date on all vaccines. This information is not  intended to replace advice given to you by your health care provider. Make sure you discuss any questions you have with your health care provider. Document Revised: 09/06/2017 Document Reviewed: 09/06/2017 Elsevier Patient Education  2020 [employer].

## 2019-08-12 ENCOUNTER — Ambulatory Visit (INDEPENDENT_AMBULATORY_CARE_PROVIDER_SITE_OTHER): Payer: Self-pay | Admitting: Family Medicine

## 2019-08-12 ENCOUNTER — Encounter: Payer: Self-pay | Admitting: Family Medicine

## 2019-08-12 DIAGNOSIS — K122 Cellulitis and abscess of mouth: Secondary | ICD-10-CM

## 2019-08-12 MED ORDER — CLINDAMYCIN HCL 300 MG PO CAPS: 300.0000 mg | ORAL_CAPSULE | Freq: Three times a day (TID) | ORAL | 0 refills | Status: DC

## 2019-08-12 NOTE — Progress Notes (Signed)
   Virtual Visit via Telephone Note  I connected with Laura Golden on 08/12/19 at 1:49 PM by telephone and verified that I am speaking with the correct person using two identifiers. Laura Golden is currently located at home and nobody is currently with her during this visit. The provider, Gwenlyn Fudge, FNP is located in their office at time of visit.  I discussed the limitations, risks, security and privacy concerns of performing an evaluation and management service by telephone and the availability of in person appointments. I also discussed with the patient that there may be a patient responsible charge related to this service. The patient expressed understanding and agreed to proceed.  Subjective: PCP: Gwenlyn Fudge, FNP  Chief Complaint  Patient presents with  . Mouth Lesions   Patient reports she has 2 areas in her mouth that are painful, red, and swollen.  One is in the front, the other is in the back on the bottom left.  She has been taking ibuprofen for pain.  Swishing warm salt water and peroxide to help with the infection.  States she already had to have all of her teeth removed on the top and is afraid now she is going to have to do the bottom.  She does not have dental insurance.   ROS: Per HPI  Current Outpatient Medications:  .  acetaminophen (TYLENOL) 325 MG tablet, Take 325 mg by mouth every 6 (six) hours as needed for mild pain or headache., Disp: , Rfl:  .  ibuprofen (ADVIL,MOTRIN) 800 MG tablet, Take 800 mg by mouth every 8 (eight) hours as needed for fever or moderate pain., Disp: , Rfl:  .  lisinopril (ZESTRIL) 10 MG tablet, Take 1 tablet (10 mg total) by mouth daily., Disp: 30 tablet, Rfl: 2 .  triamcinolone cream (KENALOG) 0.1 %, Apply topically 2 (two) times daily., Disp: 60 g, Rfl: 0  Allergies  Allergen Reactions  . Penicillins    History reviewed. No pertinent past medical history.  Observations/Objective: A&O  No respiratory distress or wheezing  audible over the phone Mood, judgement, and thought processes all WNL   Assessment and Plan: 1. Oral abscess - Discussed with patient that she needs to get into see a dentist as this is likely to recur until she takes care of the problem.  Continue ibuprofen for pain relief. - clindamycin (CLEOCIN) 300 MG capsule; Take 1 capsule (300 mg total) by mouth 3 (three) times daily for 10 days.  Dispense: 30 capsule; Refill: 0   Follow Up Instructions:  I discussed the assessment and treatment plan with the patient. The patient was provided an opportunity to ask questions and all were answered. The patient agreed with the plan and demonstrated an understanding of the instructions.   The patient was advised to call back or seek an in-person evaluation if the symptoms worsen or if the condition fails to improve as anticipated.  The above assessment and management plan was discussed with the patient. The patient verbalized understanding of and has agreed to the management plan. Patient is aware to call the clinic if symptoms persist or worsen. Patient is aware when to return to the clinic for a follow-up visit. Patient educated on when it is appropriate to go to the emergency department.   Time call ended: 1:57 PM  I provided 10 minutes of non-face-to-face time during this encounter.  Deliah Boston, MSN, APRN, FNP-C [facility] 08/12/19

## 2019-08-20 ENCOUNTER — Telehealth: Payer: Self-pay | Admitting: Family Medicine

## 2019-08-20 DIAGNOSIS — K122 Cellulitis and abscess of mouth: Secondary | ICD-10-CM

## 2019-08-20 DIAGNOSIS — I10 Essential (primary) hypertension: Secondary | ICD-10-CM

## 2019-08-20 MED ORDER — LISINOPRIL 10 MG PO TABS: 10.0000 mg | ORAL_TABLET | Freq: Every day | ORAL | 2 refills | Status: DC

## 2019-08-20 MED ORDER — CLINDAMYCIN HCL 300 MG PO CAPS: 300.0000 mg | ORAL_CAPSULE | Freq: Three times a day (TID) | ORAL | 0 refills | Status: AC

## 2019-08-20 NOTE — Telephone Encounter (Signed)
Pt requesting her blood pressure and antibiotic medication to be sent to [facility] in [place]. States she cannot afford the medication at [facility] and [facility] will not transfer the medication.

## 2019-10-22 ENCOUNTER — Ambulatory Visit: Payer: Medicaid Other | Admitting: Family Medicine

## 2019-10-24 ENCOUNTER — Ambulatory Visit: Payer: Medicaid Other | Admitting: Family Medicine

## 2019-10-31 ENCOUNTER — Other Ambulatory Visit: Payer: Self-pay

## 2019-10-31 ENCOUNTER — Ambulatory Visit (INDEPENDENT_AMBULATORY_CARE_PROVIDER_SITE_OTHER): Payer: Medicaid Other | Admitting: Nurse Practitioner

## 2019-10-31 ENCOUNTER — Encounter: Payer: Self-pay | Admitting: Nurse Practitioner

## 2019-10-31 VITALS — BP 128/85 | HR 82 | Temp 98.4°F | Ht 59.0 in | Wt 107.0 lb

## 2019-10-31 DIAGNOSIS — Z23 Encounter for immunization: Secondary | ICD-10-CM

## 2019-10-31 DIAGNOSIS — W57XXXA Bitten or stung by nonvenomous insect and other nonvenomous arthropods, initial encounter: Secondary | ICD-10-CM

## 2019-10-31 DIAGNOSIS — I10 Essential (primary) hypertension: Secondary | ICD-10-CM | POA: Insufficient documentation

## 2019-10-31 DIAGNOSIS — Z720 Tobacco use: Secondary | ICD-10-CM

## 2019-10-31 MED ORDER — LISINOPRIL 20 MG PO TABS: 20.0000 mg | ORAL_TABLET | Freq: Every day | ORAL | 1 refills | Status: DC

## 2019-10-31 MED ORDER — TRIAMCINOLONE ACETONIDE 0.1 % EX CREA: TOPICAL_CREAM | Freq: Two times a day (BID) | CUTANEOUS | 0 refills | Status: DC

## 2019-10-31 MED ORDER — LISINOPRIL 10 MG PO TABS: 10.0000 mg | ORAL_TABLET | Freq: Every day | ORAL | 2 refills | Status: DC

## 2019-10-31 NOTE — Progress Notes (Signed)
Established Patient Office Visit  Subjective:  Patient ID: Laura Golden, female    DOB: 1965/03/08  Age: 54 y.o. MRN: [id number]  CC:  Chief Complaint  Patient presents with  . Hypertension    8 week follow uo joyce patient     HPI [facility] presents for Pt presents for follow up of hypertension. Patient was diagnosed in 2021.. The patient is tolerating the medication well without side effects. Compliance with treatment has been good; including taking medication as directed , maintains a healthy diet and regular exercise regimen , and following up as directed.    Past Surgical History:  Procedure Laterality Date  . ABDOMINAL HYSTERECTOMY    . foot sx      Family History  Problem Relation Age of Onset  . Diabetes Mother   . Heart disease Mother   . Hypertension Mother   . Liver disease Father   . Hypertension Sister   . Hyperlipidemia Brother   . Asthma Daughter   . Allergies Daughter     Social History   Socioeconomic History  . Marital status: Single    Spouse name: Not on file  . Number of children: Not on file  . Years of education: Not on file  . Highest education level: Not on file  Occupational History  . Not on file  Tobacco Use  . Smoking status: Current Every Day Smoker    Packs/day: 0.25    Types: Cigarettes    Start date: 07/21/1981  . Smokeless tobacco: Never Used  Vaping Use  . Vaping Use: Never used  Substance and Sexual Activity  . Alcohol use: Yes    Comment: occ  . Drug use: Never  . Sexual activity: Not Currently  Other Topics Concern  . Not on file  Social History Narrative  . Not on file   Social Determinants of Health                                                                         Outpatient Medications Prior to Visit  Medication Sig Dispense Refill  . acetaminophen (TYLENOL) 325 MG tablet Take 325 mg by mouth every 6 (six) hours as needed for mild pain or headache.    . ibuprofen  (ADVIL,MOTRIN) 800 MG tablet Take 800 mg by mouth every 8 (eight) hours as needed for fever or moderate pain.    Marland Kitchen lisinopril (ZESTRIL) 10 MG tablet Take 1 tablet (10 mg total) by mouth daily. 30 tablet 2  . triamcinolone cream (KENALOG) 0.1 % Apply topically 2 (two) times daily. 60 g 0   No facility-administered medications prior to visit.    Allergies  Allergen Reactions  . Penicillins     ROS Review of Systems  Respiratory: Negative for cough.   Skin: Positive for rash.       Healing Bug bite  Neurological: Negative for light-headedness and headaches.  All other systems reviewed and are negative.     Objective:    Physical Exam Vitals reviewed.  Constitutional:      Appearance: Normal appearance.  HENT:     Head: Normocephalic.  Eyes:     Conjunctiva/sclera: Conjunctivae normal.  Cardiovascular:     Rate and Rhythm:  Normal rate and regular rhythm.     Pulses: Normal pulses.     Heart sounds: Normal heart sounds.  Pulmonary:     Effort: Pulmonary effort is normal.     Breath sounds: Normal breath sounds.  Abdominal:     General: Bowel sounds are normal.  Musculoskeletal:        General: Normal range of motion.  Skin:    General: Skin is warm.     Findings: Rash present.  Neurological:     Mental Status: She is alert and oriented to person, place, and time.  Psychiatric:        Mood and Affect: Mood normal.        Behavior: Behavior normal.     There were no vitals taken for this visit. Wt Readings from Last 3 Encounters:  07/22/19 109 lb 12.8 oz (49.8 kg)  04/23/19 110 lb (49.9 kg)  10/22/17 106 lb (48.1 kg)     There are no preventive care reminders to display for this patient.  There are no preventive care reminders to display for this patient.  No results found for: TSH Lab Results  Component Value Date   WBC 6.0 10/22/2017   HGB 15.3 (H) 10/22/2017   HCT 48.2 (H) 10/22/2017   MCV 97.4 10/22/2017   PLT 256 10/22/2017   Lab Results   Component Value Date   NA 139 10/22/2017   K 4.0 10/22/2017   CO2 24 10/22/2017   GLUCOSE 88 10/22/2017   BUN 19 10/22/2017   CREATININE 0.90 10/22/2017   BILITOT 0.7 10/22/2017   ALKPHOS 65 10/22/2017   AST 20 10/22/2017   ALT 17 10/22/2017   PROT 8.0 10/22/2017   ALBUMIN 4.5 10/22/2017   CALCIUM 9.3 10/22/2017   ANIONGAP 9 10/22/2017      Assessment & Plan:   Problem List Items Addressed This Visit      Cardiovascular and Mediastinum   Essential hypertension - Primary    Essential hypertension not well managed on current medication.  Reviewed patient blood pressure log for 2 weeks, blood pressure running 111/95 , 152/108, 130/89.  Changed lisinopril from 10 mg to 20 mg daily advised patient to take medication as prescribed, take blood pressure log and follow-up in 2 weeks.  Provided education to patient with printed handouts given.  Rx sent to pharmacy.      Relevant Medications   lisinopril (ZESTRIL) 20 MG tablet     Other   Tobacco abuse    Patient continues to smoke and not ready for smoking cessation.  Provided counseling and education.  Printed handouts given to patient on how to start quitting smoking cigarettes.  Patient verbalized understanding.  We will continue to assess.       Other Visit Diagnoses    Bug bite, initial encounter       Relevant Medications   triamcinolone cream (KENALOG) 0.1 %   Need for immunization against influenza       Relevant Orders   Flu Vaccine QUAD 36+ mos IM (Completed)      Meds ordered this encounter  Medications  . triamcinolone cream (KENALOG) 0.1 %    Sig: Apply topically 2 (two) times daily.    Dispense:  60 g    Refill:  0  . DISCONTD: lisinopril (ZESTRIL) 10 MG tablet    Sig: Take 1 tablet (10 mg total) by mouth daily.    Dispense:  30 tablet    Refill:  2  .  DISCONTD: lisinopril (ZESTRIL) 20 MG tablet    Sig: Take 1 tablet (20 mg total) by mouth daily.    Dispense:  90 tablet    Refill:  1    Order  Specific Question:   Supervising Provider    Answer:   Arville Care A [id number]  . lisinopril (ZESTRIL) 20 MG tablet    Sig: Take 1 tablet (20 mg total) by mouth daily.    Dispense:  90 tablet    Refill:  1    Order Specific Question:   Supervising Provider    Answer:   Arville Care A [[id number]]    Follow-up: Return in about 3 months (around 01/31/2020).    Daryll Drown, NP

## 2019-10-31 NOTE — Assessment & Plan Note (Signed)
Patient continues to smoke and not ready for smoking cessation.  Provided counseling and education.  Printed handouts given to patient on how to start quitting smoking cigarettes.  Patient verbalized understanding.  We will continue to assess.

## 2019-10-31 NOTE — Assessment & Plan Note (Signed)
Essential hypertension not well managed on current medication.  Reviewed patient blood pressure log for 2 weeks, blood pressure running 111/95 , 152/108, 130/89.  Changed lisinopril from 10 mg to 20 mg daily advised patient to take medication as prescribed, take blood pressure log and follow-up in 2 weeks.  Provided education to patient with printed handouts given.  Rx sent to pharmacy.

## 2019-10-31 NOTE — Patient Instructions (Addendum)
Increase lisinopril to 20 mg, please take blood pressure log, follow up in 2 weeks  Hypertension, Adult Hypertension is another name for high blood pressure. High blood pressure forces your heart to work harder to pump blood. This can cause problems over time. There are two numbers in a blood pressure reading. There is a top number (systolic) over a bottom number (diastolic). It is best to have a blood pressure that is below 120/80. Healthy choices can help lower your blood pressure, or you may need medicine to help lower it. What are the causes? The cause of this condition is not known. Some conditions may be related to high blood pressure. What increases the risk?  Smoking.  Having type 2 diabetes mellitus, high cholesterol, or both.  Not getting enough exercise or physical activity.  Being overweight.  Having too much fat, sugar, calories, or salt (sodium) in your diet.  Drinking too much alcohol.  Having long-term (chronic) kidney disease.  Having a family history of high blood pressure.  Age. Risk increases with age.  Race. You may be at higher risk if you are African American.  Gender. Men are at higher risk than women before age 75. After age 37, women are at higher risk than men.  Having obstructive sleep apnea.  Stress. What are the signs or symptoms?  High blood pressure may not cause symptoms. Very high blood pressure (hypertensive crisis) may cause: ? Headache. ? Feelings of worry or nervousness (anxiety). ? Shortness of breath. ? Nosebleed. ? A feeling of being sick to your stomach (nausea). ? Throwing up (vomiting). ? Changes in how you see. ? Very bad chest pain. ? Seizures. How is this treated?  This condition is treated by making healthy lifestyle changes, such as: ? Eating healthy foods. ? Exercising more. ? Drinking less alcohol.  Your health care provider may prescribe medicine if lifestyle changes are not enough to get your blood pressure  under control, and if: ? Your top number is above 130. ? Your bottom number is above 80.  Your personal target blood pressure may vary. Follow these instructions at home: Eating and drinking   If told, follow the DASH eating plan. To follow this plan: ? Fill one half of your plate at each meal with fruits and vegetables. ? Fill one fourth of your plate at each meal with whole grains. Whole grains include whole-wheat pasta, brown rice, and whole-grain bread. ? Eat or drink low-fat dairy products, such as skim milk or low-fat yogurt. ? Fill one fourth of your plate at each meal with low-fat (lean) proteins. Low-fat proteins include fish, chicken without skin, eggs, beans, and tofu. ? Avoid fatty meat, cured and processed meat, or chicken with skin. ? Avoid pre-made or processed food.  Eat less than 1,500 mg of salt each day.  Do not drink alcohol if: ? Your doctor tells you not to drink. ? You are pregnant, may be pregnant, or are planning to become pregnant.  If you drink alcohol: ? Limit how much you use to:  0-1 drink a day for women.  0-2 drinks a day for men. ? Be aware of how much alcohol is in your drink. In the [place] one drink equals one 12 oz bottle of beer (355 mL), one 5 oz glass of wine (148 mL), or one 1 oz glass of hard liquor (44 mL). Lifestyle   Work with your doctor to stay at a healthy weight or to lose weight. Ask your doctor  what the best weight is for you.  Get at least 30 minutes of exercise most days of the week. This may include walking, swimming, or biking.  Get at least 30 minutes of exercise that strengthens your muscles (resistance exercise) at least 3 days a week. This may include lifting weights or doing Pilates.  Do not use any products that contain nicotine or tobacco, such as cigarettes, e-cigarettes, and chewing tobacco. If you need help quitting, ask your doctor.  Check your blood pressure at home as told by your doctor.  Keep all follow-up  visits as told by your doctor. This is important. Medicines  Take over-the-counter and prescription medicines only as told by your doctor. Follow directions carefully.  Do not skip doses of blood pressure medicine. The medicine does not work as well if you skip doses. Skipping doses also puts you at risk for problems.  Ask your doctor about side effects or reactions to medicines that you should watch for. Contact a doctor if you:  Think you are having a reaction to the medicine you are taking.  Have headaches that keep coming back (recurring).  Feel dizzy.  Have swelling in your ankles.  Have trouble with your vision. Get help right away if you:  Get a very bad headache.  Start to feel mixed up (confused).  Feel weak or numb.  Feel faint.  Have very bad pain in your: ? Chest. ? Belly (abdomen).  Throw up more than once.  Have trouble breathing. Summary  Hypertension is another name for high blood pressure.  High blood pressure forces your heart to work harder to pump blood.  For most people, a normal blood pressure is less than 120/80.  Making healthy choices can help lower blood pressure. If your blood pressure does not get lower with healthy choices, you may need to take medicine. This information is not intended to replace advice given to you by your health care provider. Make sure you discuss any questions you have with your health care provider. Document Revised: 09/05/2017 Document Reviewed: 09/05/2017 Elsevier Patient Education  2020 [employer].  Steps to Quit Smoking Smoking tobacco is the leading cause of preventable death. It can affect almost every organ in the body. Smoking puts you and those around you at risk for developing many serious chronic diseases. Quitting smoking can be difficult, but it is one of the best things that you can do for your health. It is never too late to quit. How do I get ready to quit? When you decide to quit smoking,  create a plan to help you succeed. Before you quit:  Pick a date to quit. Set a date within the next 2 weeks to give you time to prepare.  Write down the reasons why you are quitting. Keep this list in places where you will see it often.  Tell your family, friends, and co-workers that you are quitting. Support from your loved ones can make quitting easier.  Talk with your health care provider about your options for quitting smoking.  Find out what treatment options are covered by your health insurance.  Identify people, places, things, and activities that make you want to smoke (triggers). Avoid them. What first steps can I take to quit smoking?  Throw away all cigarettes at home, at work, and in your car.  Throw away smoking accessories, such as Set designer.  Clean your car. Make sure to empty the ashtray.  Clean your home, including curtains and  carpets. What strategies can I use to quit smoking? Talk with your health care provider about combining strategies, such as taking medicines while you are also receiving in-person counseling. Using these two strategies together makes you more likely to succeed in quitting than if you used either strategy on its own.  If you are pregnant or breastfeeding, talk with your health care provider about finding counseling or other support strategies to quit smoking. Do not take medicine to help you quit smoking unless your health care provider tells you to do so. To quit smoking: Quit right away  Quit smoking completely, instead of gradually reducing how much you smoke over a period of time. Research shows that stopping smoking right away is more successful than gradually quitting.  Attend in-person counseling to help you build problem-solving skills. You are more likely to succeed in quitting if you attend counseling sessions regularly. Even short sessions of 10 minutes can be effective. Take medicine You may take medicines to help you  quit smoking. Some medicines require a prescription and some you can purchase over-the-counter. Medicines may have nicotine in them to replace the nicotine in cigarettes. Medicines may:  Help to stop cravings.  Help to relieve withdrawal symptoms. Your health care provider may recommend:  Nicotine patches, gum, or lozenges.  Nicotine inhalers or sprays.  Non-nicotine medicine that is taken by mouth. Find resources Find resources and support systems that can help you to quit smoking and remain smoke-free after you quit. These resources are most helpful when you use them often. They include:  Online chats with a Veterinary surgeon.  Telephone quitlines.  Printed Materials engineer.  Support groups or group counseling.  Text messaging programs.  Mobile phone apps or applications. Use apps that can help you stick to your quit plan by providing reminders, tips, and encouragement. There are many free apps for mobile devices as well as websites. Examples include Quit Guide from the [employer] and smokefree.gov What things can I do to make it easier to quit?   Reach out to your family and friends for support and encouragement. Call telephone quitlines (1-800-QUIT-NOW), reach out to support groups, or work with a counselor for support.  Ask people who smoke to avoid smoking around you.  Avoid places that trigger you to smoke, such as bars, parties, or smoke-break areas at work.  Spend time with people who do not smoke.  Lessen the stress in your life. Stress can be a smoking trigger for some people. To lessen stress, try: ? Exercising regularly. ? Doing deep-breathing exercises. ? Doing yoga. ? Meditating. ? Performing a body scan. This involves closing your eyes, scanning your body from head to toe, and noticing which parts of your body are particularly tense. Try to relax the muscles in those areas. How will I feel when I quit smoking? Day 1 to 3 weeks Within the first 24 hours of quitting  smoking, you may start to feel withdrawal symptoms. These symptoms are usually most noticeable 2-3 days after quitting, but they usually do not last for more than 2-3 weeks. You may experience these symptoms:  Mood swings.  Restlessness, anxiety, or irritability.  Trouble concentrating.  Dizziness.  Strong cravings for sugary foods and nicotine.  Mild weight gain.  Constipation.  Nausea.  Coughing or a sore throat.  Changes in how the medicines that you take for unrelated issues work in your body.  Depression.  Trouble sleeping (insomnia). Week 3 and afterward After the first 2-3 weeks of quitting,  you may start to notice more positive results, such as:  Improved sense of smell and taste.  Decreased coughing and sore throat.  Slower heart rate.  Lower blood pressure.  Clearer skin.  The ability to breathe more easily.  Fewer sick days. Quitting smoking can be very challenging. Do not get discouraged if you are not successful the first time. Some people need to make many attempts to quit before they achieve long-term success. Do your best to stick to your quit plan, and talk with your health care provider if you have any questions or concerns. Summary  Smoking tobacco is the leading cause of preventable death. Quitting smoking is one of the best things that you can do for your health.  When you decide to quit smoking, create a plan to help you succeed.  Quit smoking right away, not slowly over a period of time.  When you start quitting, seek help from your health care provider, family, or friends. This information is not intended to replace advice given to you by your health care provider. Make sure you discuss any questions you have with your health care provider. Document Revised: 09/20/2018 Document Reviewed: 03/16/2018 Elsevier Patient Education  2020 [employer]

## 2019-11-17 ENCOUNTER — Encounter: Payer: Self-pay | Admitting: Nurse Practitioner

## 2019-11-17 ENCOUNTER — Ambulatory Visit (INDEPENDENT_AMBULATORY_CARE_PROVIDER_SITE_OTHER): Payer: Self-pay | Admitting: Nurse Practitioner

## 2019-11-17 ENCOUNTER — Other Ambulatory Visit: Payer: Self-pay

## 2019-11-17 VITALS — BP 136/93 | HR 83 | Temp 97.6°F | Ht 59.0 in | Wt 105.0 lb

## 2019-11-17 DIAGNOSIS — I10 Essential (primary) hypertension: Secondary | ICD-10-CM

## 2019-11-17 DIAGNOSIS — Z720 Tobacco use: Secondary | ICD-10-CM

## 2019-11-17 NOTE — Assessment & Plan Note (Signed)
Tobacco abuse not well managed.  Continue to provide education on smoking cessation.

## 2019-11-17 NOTE — Assessment & Plan Note (Signed)
Patient is following up today for hypertension.  Patient has not started new dose of medication given on last visit.  Blood pressure is still elevated.  Advised patient to pick up blood pressure medicine from the pharmacy today and administer as prescribed.  Keep a blood pressure log for a week and follow-up in 2 weeks with blood pressure readings.

## 2019-11-17 NOTE — Progress Notes (Signed)
Established Patient Office Visit  Subjective:  Patient ID: Laura Golden, female    DOB: 10-10-1965  Age: 54 y.o. MRN: [id number]  CC:  Chief Complaint  Patient presents with  . Hypertension    HPI [facility] presents for follow up of hypertension. Patient was diagnosed in ____. The patient is tolerating the medication well without side effects. Compliance with treatment has been good; including taking medication as directed , maintains a healthy diet and regular exercise regimen , and following up as directed.  History reviewed. No pertinent past medical history.  Past Surgical History:  Procedure Laterality Date  . ABDOMINAL HYSTERECTOMY    . foot sx      Family History  Problem Relation Age of Onset  . Diabetes Mother   . Heart disease Mother   . Hypertension Mother   . Liver disease Father   . Hypertension Sister   . Hyperlipidemia Brother   . Asthma Daughter   . Allergies Daughter     Social History   Socioeconomic History  . Marital status: Single    Spouse name: Not on file  . Number of children: Not on file  . Years of education: Not on file  . Highest education level: Not on file  Occupational History  . Not on file  Tobacco Use  . Smoking status: Current Every Day Smoker    Packs/day: 0.25    Types: Cigarettes    Start date: 07/21/1981  . Smokeless tobacco: Never Used  Vaping Use  . Vaping Use: Never used  Substance and Sexual Activity  . Alcohol use: Yes    Comment: occ  . Drug use: Never  . Sexual activity: Not Currently  Other Topics Concern  . Not on file  Social History Narrative  . Not on file   Social Determinants of Health   Financial Resource Strain:   . Difficulty of Paying Living Expenses: Not on file  Food Insecurity:   . Worried About Programme researcher, broadcasting/film/video in the Last Year: Not on file  . Ran Out of Food in the Last Year: Not on file  Transportation Needs:   . Lack of Transportation (Medical): Not on file  . Lack of  Transportation (Non-Medical): Not on file  Physical Activity:   . Days of Exercise per Week: Not on file  . Minutes of Exercise per Session: Not on file  Stress:   . Feeling of Stress : Not on file  Social Connections:   . Frequency of Communication with Friends and Family: Not on file  . Frequency of Social Gatherings with Friends and Family: Not on file  . Attends Religious Services: Not on file  . Active Member of Clubs or Organizations: Not on file  . Attends Banker Meetings: Not on file  . Marital Status: Not on file  Intimate Partner Violence:   . Fear of Current or Ex-Partner: Not on file  . Emotionally Abused: Not on file  . Physically Abused: Not on file  . Sexually Abused: Not on file    Outpatient Medications Prior to Visit  Medication Sig Dispense Refill  . acetaminophen (TYLENOL) 325 MG tablet Take 325 mg by mouth every 6 (six) hours as needed for mild pain or headache.    . ibuprofen (ADVIL,MOTRIN) 800 MG tablet Take 800 mg by mouth every 8 (eight) hours as needed for fever or moderate pain.    Marland Kitchen lisinopril (ZESTRIL) 20 MG tablet Take 1 tablet (20 mg total)  by mouth daily. 90 tablet 1  . triamcinolone cream (KENALOG) 0.1 % Apply topically 2 (two) times daily. 60 g 0   No facility-administered medications prior to visit.    Allergies  Allergen Reactions  . Penicillins     ROS Review of Systems  Neurological: Negative for light-headedness, numbness and headaches.  All other systems reviewed and are negative.     Objective:    Physical Exam Vitals reviewed.  Constitutional:      Appearance: Normal appearance.  HENT:     Head: Normocephalic.  Eyes:     Conjunctiva/sclera: Conjunctivae normal.  Cardiovascular:     Rate and Rhythm: Normal rate.     Pulses: Normal pulses.     Heart sounds: Normal heart sounds.  Pulmonary:     Effort: Pulmonary effort is normal.     Breath sounds: Normal breath sounds.  Abdominal:     General: Bowel  sounds are normal.  Skin:    General: Skin is warm.  Neurological:     Mental Status: She is alert and oriented to person, place, and time.  Psychiatric:        Mood and Affect: Mood normal.        Behavior: Behavior normal.     BP (!) 136/93   Pulse 83   Temp 97.6 F (36.4 C)   Ht 4\' 11"  (1.499 m)   Wt 105 lb (47.6 kg)   SpO2 97%   BMI 21.21 kg/m  Wt Readings from Last 3 Encounters:  11/17/19 105 lb (47.6 kg)  10/31/19 107 lb (48.5 kg)  07/22/19 109 lb 12.8 oz (49.8 kg)     There are no preventive care reminders to display for this patient.  There are no preventive care reminders to display for this patient.  No results found for: TSH Lab Results  Component Value Date   WBC 6.0 10/22/2017   HGB 15.3 (H) 10/22/2017   HCT 48.2 (H) 10/22/2017   MCV 97.4 10/22/2017   PLT 256 10/22/2017   Lab Results  Component Value Date   NA 139 10/22/2017   K 4.0 10/22/2017   CO2 24 10/22/2017   GLUCOSE 88 10/22/2017   BUN 19 10/22/2017   CREATININE 0.90 10/22/2017   BILITOT 0.7 10/22/2017   ALKPHOS 65 10/22/2017   AST 20 10/22/2017   ALT 17 10/22/2017   PROT 8.0 10/22/2017   ALBUMIN 4.5 10/22/2017   CALCIUM 9.3 10/22/2017   ANIONGAP 9 10/22/2017      Assessment & Plan:   Problem List Items Addressed This Visit      Cardiovascular and Mediastinum   Essential hypertension - Primary    Patient is following up today for hypertension.  Patient has not started new dose of medication given on last visit.  Blood pressure is still elevated.  Advised patient to pick up blood pressure medicine from the pharmacy today and administer as prescribed.  Keep a blood pressure log for a week and follow-up in 2 weeks with blood pressure readings.         Other   Tobacco abuse    Tobacco abuse not well managed.  I continue to provide education on smoking cessation.            Follow-up: Return in about 2 weeks (around 12/01/2019).    12/03/2019, NP

## 2019-11-17 NOTE — Patient Instructions (Addendum)
Patient is following up today for hypertension.  Patient has not started new dose of medication given on last visit.  Blood pressure is still elevated.  Advised patient to pick up blood pressure medicine from the pharmacy today and administer as prescribed.  Keep a blood pressure log for a week and follow-up in 2 weeks with blood pressure readings.  I continue to provide education on smoking cessation.    Hypertension, Adult Hypertension is another name for high blood pressure. High blood pressure forces your heart to work harder to pump blood. This can cause problems over time. There are two numbers in a blood pressure reading. There is a top number (systolic) over a bottom number (diastolic). It is best to have a blood pressure that is below 120/80. Healthy choices can help lower your blood pressure, or you may need medicine to help lower it. What are the causes? The cause of this condition is not known. Some conditions may be related to high blood pressure. What increases the risk?  Smoking.  Having type 2 diabetes mellitus, high cholesterol, or both.  Not getting enough exercise or physical activity.  Being overweight.  Having too much fat, sugar, calories, or salt (sodium) in your diet.  Drinking too much alcohol.  Having long-term (chronic) kidney disease.  Having a family history of high blood pressure.  Age. Risk increases with age.  Race. You may be at higher risk if you are African American.  Gender. Men are at higher risk than women before age 56. After age 56, women are at higher risk than men.  Having obstructive sleep apnea.  Stress. What are the signs or symptoms?  High blood pressure may not cause symptoms. Very high blood pressure (hypertensive crisis) may cause: ? Headache. ? Feelings of worry or nervousness (anxiety). ? Shortness of breath. ? Nosebleed. ? A feeling of being sick to your stomach (nausea). ? Throwing up (vomiting). ? Changes in how you  see. ? Very bad chest pain. ? Seizures. How is this treated?  This condition is treated by making healthy lifestyle changes, such as: ? Eating healthy foods. ? Exercising more. ? Drinking less alcohol.  Your health care provider may prescribe medicine if lifestyle changes are not enough to get your blood pressure under control, and if: ? Your top number is above 130. ? Your bottom number is above 80.  Your personal target blood pressure may vary. Follow these instructions at home: Eating and drinking   If told, follow the DASH eating plan. To follow this plan: ? Fill one half of your plate at each meal with fruits and vegetables. ? Fill one fourth of your plate at each meal with whole grains. Whole grains include whole-wheat pasta, brown rice, and whole-grain bread. ? Eat or drink low-fat dairy products, such as skim milk or low-fat yogurt. ? Fill one fourth of your plate at each meal with low-fat (lean) proteins. Low-fat proteins include fish, chicken without skin, eggs, beans, and tofu. ? Avoid fatty meat, cured and processed meat, or chicken with skin. ? Avoid pre-made or processed food.  Eat less than 1,500 mg of salt each day.  Do not drink alcohol if: ? Your doctor tells you not to drink. ? You are pregnant, may be pregnant, or are planning to become pregnant.  If you drink alcohol: ? Limit how much you use to:  0-1 drink a day for women.  0-2 drinks a day for men. ? Be aware of how much  alcohol is in your drink. In the [place] one drink equals one 12 oz bottle of beer (355 mL), one 5 oz glass of wine (148 mL), or one 1 oz glass of hard liquor (44 mL). Lifestyle   Work with your doctor to stay at a healthy weight or to lose weight. Ask your doctor what the best weight is for you.  Get at least 30 minutes of exercise most days of the week. This may include walking, swimming, or biking.  Get at least 30 minutes of exercise that strengthens your muscles (resistance  exercise) at least 3 days a week. This may include lifting weights or doing Pilates.  Do not use any products that contain nicotine or tobacco, such as cigarettes, e-cigarettes, and chewing tobacco. If you need help quitting, ask your doctor.  Check your blood pressure at home as told by your doctor.  Keep all follow-up visits as told by your doctor. This is important. Medicines  Take over-the-counter and prescription medicines only as told by your doctor. Follow directions carefully.  Do not skip doses of blood pressure medicine. The medicine does not work as well if you skip doses. Skipping doses also puts you at risk for problems.  Ask your doctor about side effects or reactions to medicines that you should watch for. Contact a doctor if you:  Think you are having a reaction to the medicine you are taking.  Have headaches that keep coming back (recurring).  Feel dizzy.  Have swelling in your ankles.  Have trouble with your vision. Get help right away if you:  Get a very bad headache.  Start to feel mixed up (confused).  Feel weak or numb.  Feel faint.  Have very bad pain in your: ? Chest. ? Belly (abdomen).  Throw up more than once.  Have trouble breathing. Summary  Hypertension is another name for high blood pressure.  High blood pressure forces your heart to work harder to pump blood.  For most people, a normal blood pressure is less than 120/80.  Making healthy choices can help lower blood pressure. If your blood pressure does not get lower with healthy choices, you may need to take medicine. This information is not intended to replace advice given to you by your health care provider. Make sure you discuss any questions you have with your health care provider. Document Revised: 09/05/2017 Document Reviewed: 09/05/2017 Elsevier Patient Education  2020 [employer].

## 2019-12-01 ENCOUNTER — Ambulatory Visit (INDEPENDENT_AMBULATORY_CARE_PROVIDER_SITE_OTHER): Payer: Medicaid Other | Admitting: Nurse Practitioner

## 2019-12-01 ENCOUNTER — Encounter: Payer: Self-pay | Admitting: Nurse Practitioner

## 2019-12-01 DIAGNOSIS — I10 Essential (primary) hypertension: Secondary | ICD-10-CM

## 2019-12-01 NOTE — Assessment & Plan Note (Signed)
Blood pressure well controlled on current medication no changes to dose.  Continue healthy diet and exercise regimen as tolerated. Follow-up in 3 months.

## 2019-12-01 NOTE — Progress Notes (Signed)
   Virtual Visit via telephone Note Due to COVID-19 pandemic this visit was conducted virtually. This visit type was conducted due to national recommendations for restrictions regarding the COVID-19 Pandemic (e.g. social distancing, sheltering in place) in an effort to limit this patient's exposure and mitigate transmission in our community. All issues noted in this document were discussed and addressed.  A physical exam was not performed with this format.  I connected with [name] on 12/01/19 at 10:35 am  by telephone and verified that I am speaking with the correct person using two identifiers. Laura Golden is currently located at home and no one  is currently with patient during visit. The provider, Daryll Drown, NP is located in their office at time of visit.  I discussed the limitations, risks, security and privacy concerns of performing an evaluation and management service by telephone and the availability of in person appointments. I also discussed with the patient that there may be a patient responsible charge related to this service. The patient expressed understanding and agreed to proceed.   History and Present Illness:  HPI Laura Golden is a 54 year old female following up for  hypertension. Patient was diagnosed in 10/31/19. The patient is tolerating the medication well without side effects. Compliance with treatment has been good; including taking medication as directed , maintains a healthy diet and regular exercise regimen , and following up as directed.  Current medication lisinopril 20 mg tablet daily.     Review of Systems  Neurological: Negative for headaches.  Psychiatric/Behavioral: The patient is not nervous/anxious.   All other systems reviewed and are negative.    Observations/Objective:  Tele visit  Assessment and Plan:   Blood pressure well managed on lisinopril 20 mg tablet daily. Follow up in 3 months or with unresolved or worsening blood pressure  symptoms   Follow Up Instructions:  3 months    I discussed the assessment and treatment plan with the patient. The patient was provided an opportunity to ask questions and all were answered. The patient agreed with the plan and demonstrated an understanding of the instructions.   The patient was advised to call back or seek an in-person evaluation if the symptoms worsen or if the condition fails to improve as anticipated.  The above assessment and management plan was discussed with the patient. The patient verbalized understanding of and has agreed to the management plan. Patient is aware to call the clinic if symptoms persist or worsen. Patient is aware when to return to the clinic for a follow-up visit. Patient educated on when it is appropriate to go to the emergency department.   Time call ended:  10:43 am  I provided 9  minutes of non-face-to-face time during this encounter.    Daryll Drown, NP

## 2019-12-15 ENCOUNTER — Telehealth: Payer: Self-pay

## 2019-12-15 NOTE — Telephone Encounter (Signed)
  Prescription Request  12/15/2019  What is the name of the medication or equipment? Needs more antibiotic for tooth infection  Have you contacted your pharmacy to request a refill? (if applicable) no  Which pharmacy would you like this sent [facility]   Patient notified that their request is being sent to the clinical staff for review and that they should receive a response within 2 business days.

## 2019-12-16 ENCOUNTER — Telehealth: Payer: Self-pay

## 2019-12-16 MED ORDER — CLINDAMYCIN PALMITATE HCL 75 MG/5ML PO SOLR: 300.0000 mg | Freq: Four times a day (QID) | ORAL | 0 refills | Status: DC

## 2019-12-16 NOTE — Addendum Note (Signed)
Addended by: Bennie Pierini on: 12/16/2019 11:33 AM   Modules accepted: Orders

## 2019-12-16 NOTE — Telephone Encounter (Signed)
Re: clindamycin solution, pt would like capsules instead they are cheaper Please advise and [facility]

## 2019-12-16 NOTE — Telephone Encounter (Signed)
Patient aware.

## 2019-12-16 NOTE — Telephone Encounter (Signed)
Bad tooth for the last 3 days, unable to have it removed since she does not have MCD that covers this Please advise

## 2019-12-16 NOTE — Telephone Encounter (Signed)
Antibiotic sent to pharmacy.  

## 2019-12-18 MED ORDER — CLINDAMYCIN HCL 300 MG PO CAPS: 300.0000 mg | ORAL_CAPSULE | Freq: Four times a day (QID) | ORAL | 0 refills | Status: DC

## 2020-02-20 ENCOUNTER — Telehealth: Payer: Self-pay | Admitting: Family Medicine

## 2020-02-20 ENCOUNTER — Telehealth: Payer: Self-pay

## 2020-02-20 MED ORDER — LISINOPRIL 40 MG PO TABS: 40.0000 mg | ORAL_TABLET | Freq: Every day | ORAL | 2 refills | Status: DC

## 2020-02-20 NOTE — Telephone Encounter (Signed)
Mailbox full-cb 2/11

## 2020-02-20 NOTE — Telephone Encounter (Signed)
Patient aware to increase Lisinopril to 40 mg per day.

## 2020-02-20 NOTE — Addendum Note (Signed)
Addended by: Gwenlyn Fudge on: 02/20/2020 12:36 PM   Modules accepted: Orders

## 2020-02-20 NOTE — Telephone Encounter (Signed)
See previous telephone call from today, documented there

## 2020-02-20 NOTE — Telephone Encounter (Signed)
LOV 12/01/19 televisit  bp meds changed from lisinopril 10 mg to 20 mg and this visit was a follow up.  Patient advised to follow up in 3 months - patient has no insurance and is not able to afford appt at this time.  Patient sent in BP log I have place in provider's inbox to review. Please advise

## 2020-02-20 NOTE — Telephone Encounter (Signed)
According to documentation patient's systolic blood pressure ranges 100-176 with 6/11 > 140.  The systolic reading of 100 1 time is the only reading less than 124.  Her diastolic ranges 72-110 with 7/11 > 90.  Heart rate ranges 70-94.   I recommend patient increase lisinopril from 20 mg to 40 mg once daily.  She can take 2 of her 20 mg tablets until she uses them up.  I sent a new prescription for the 40 mg tablets [facility] [place].

## 2020-02-20 NOTE — Telephone Encounter (Signed)
Pt returning call

## 2020-03-15 ENCOUNTER — Telehealth: Payer: Self-pay

## 2020-04-16 ENCOUNTER — Other Ambulatory Visit: Payer: Self-pay

## 2020-04-16 ENCOUNTER — Encounter: Payer: Self-pay | Admitting: Family Medicine

## 2020-04-16 ENCOUNTER — Ambulatory Visit: Payer: Medicaid Other | Admitting: Family Medicine

## 2020-04-16 VITALS — BP 160/96 | HR 78 | Temp 97.5°F | Ht 59.0 in | Wt 104.6 lb

## 2020-04-16 DIAGNOSIS — J302 Other seasonal allergic rhinitis: Secondary | ICD-10-CM

## 2020-04-16 DIAGNOSIS — Z23 Encounter for immunization: Secondary | ICD-10-CM

## 2020-04-16 DIAGNOSIS — I10 Essential (primary) hypertension: Secondary | ICD-10-CM

## 2020-04-16 LAB — CBC WITH DIFFERENTIAL/PLATELET

## 2020-04-16 LAB — CMP14+EGFR
BUN: 10 mg/dL (ref 6–24)
Chloride: 104 mmol/L (ref 96–106)
Creatinine, Ser: 0.88 mg/dL (ref 0.57–1.00)
Total Protein: 7 g/dL (ref 6.0–8.5)

## 2020-04-16 MED ORDER — FLUTICASONE PROPIONATE 50 MCG/ACT NA SUSP: 2.0000 | Freq: Every day | NASAL | 6 refills | Status: DC

## 2020-04-16 MED ORDER — AMLODIPINE BESYLATE 5 MG PO TABS: 5.0000 mg | ORAL_TABLET | Freq: Every day | ORAL | 2 refills | Status: DC

## 2020-04-16 MED ORDER — LISINOPRIL 40 MG PO TABS: 40.0000 mg | ORAL_TABLET | Freq: Every day | ORAL | 1 refills | Status: DC

## 2020-04-16 MED ORDER — CETIRIZINE HCL 10 MG PO TABS: 10.0000 mg | ORAL_TABLET | Freq: Every day | ORAL | 11 refills | Status: DC

## 2020-04-16 NOTE — Progress Notes (Signed)
Assessment & Plan:  1. Essential hypertension Uncontrolled. Continue Lisinopril 40 mg daily. Start amlodipine 5 mg daily. Education provided on the DASH diet. Patient to continue keeping a log of her blood pressure at home. - lisinopril (ZESTRIL) 40 MG tablet; Take 1 tablet (40 mg total) by mouth daily.  Dispense: 90 tablet; Refill: 1 - amLODipine (NORVASC) 5 MG tablet; Take 1 tablet (5 mg total) by mouth daily.  Dispense: 30 tablet; Refill: 2 - CBC with Differential/Platelet - CMP14+EGFR - Lipid panel  2. Seasonal allergies Uncontrolled. Rx'd Zyrtec and Flonase. - cetirizine (ZYRTEC) 10 MG tablet; Take 1 tablet (10 mg total) by mouth daily.  Dispense: 30 tablet; Refill: 11 - fluticasone (FLONASE) 50 MCG/ACT nasal spray; Place 2 sprays into both nostrils daily.  Dispense: 16 g; Refill: 6  3. Immunization due - Tdap vaccine greater than or equal to 7yo IM - given in office.   Return in about 6 weeks (around 05/28/2020) for HTN.  Hendricks Limes, MSN, APRN, FNP-C [facility]  Subjective:    Patient ID: Laura Golden, female    DOB: September 23, 1965, 55 y.o.   MRN: [id number]  Patient Care Team: Loman Brooklyn, FNP as PCP - General (Family Medicine)   Chief Complaint:  Chief Complaint  Patient presents with  . Hypertension    Check up of chronic medical conditions    . Nasal Congestion    Patient states that she has been having nasal congestion for awhile and nose bleeds.     HPI: Laura Golden is a 55 y.o. female presenting on 04/16/2020 for Hypertension (Check up of chronic medical conditions /) and Nasal Congestion (Patient states that she has been having nasal congestion for awhile and nose bleeds. )  Patient has been keeping a log of her blood pressure at home. Systolic ranges 245-809 with 8/10 > 140. Diastolic ranges 98-338. Heart rate ranges 69-90.  New complaints: Patient also c/o nasal congestion, runny nose, and watery eyes that has been going on  since the end of February/beginning of March. She has taken Benadryl but states she can only take it at night because it makes her sleepy.   Social history:  Relevant past medical, surgical, family and social history reviewed and updated as indicated. Interim medical history since our last visit reviewed.  Allergies and medications reviewed and updated.  DATA REVIEWED: CHART IN EPIC  ROS: Negative unless specifically indicated above in HPI.    Current Outpatient Medications:  .  acetaminophen (TYLENOL) 325 MG tablet, Take 325 mg by mouth every 6 (six) hours as needed for mild pain or headache., Disp: , Rfl:  .  ibuprofen (ADVIL,MOTRIN) 800 MG tablet, Take 800 mg by mouth every 8 (eight) hours as needed for fever or moderate pain., Disp: , Rfl:  .  lisinopril (ZESTRIL) 40 MG tablet, Take 1 tablet (40 mg total) by mouth daily., Disp: 30 tablet, Rfl: 2   Allergies  Allergen Reactions  . Penicillins    History reviewed. No pertinent past medical history.  Past Surgical History:  Procedure Laterality Date  . ABDOMINAL HYSTERECTOMY    . foot sx      Social History   Socioeconomic History  . Marital status: Single    Spouse name: Not on file  . Number of children: Not on file  . Years of education: Not on file  . Highest education level: Not on file  Occupational History  . Not on file  Tobacco Use  . Smoking  status: Current Every Day Smoker    Packs/day: 0.25    Types: Cigarettes    Start date: 07/21/1981  . Smokeless tobacco: Never Used  Vaping Use  . Vaping Use: Never used  Substance and Sexual Activity  . Alcohol use: Yes    Comment: occ  . Drug use: Never  . Sexual activity: Not Currently  Other Topics Concern  . Not on file  Social History Narrative  . Not on file   Social Determinants of Health   Financial Resource Strain: Not on file  Food Insecurity: Not on file  Transportation Needs: Not on file  Physical Activity: Not on file  Stress: Not on file   Social Connections: Not on file  Intimate Partner Violence: Not on file        Objective:    BP (!) 160/96   Pulse 78   Temp (!) 97.5 F (36.4 C) (Temporal)   Ht '4\' 11"'  (1.499 m)   Wt 104 lb 9.6 oz (47.4 kg)   SpO2 97%   BMI 21.13 kg/m   Wt Readings from Last 3 Encounters:  04/16/20 104 lb 9.6 oz (47.4 kg)  11/17/19 105 lb (47.6 kg)  10/31/19 107 lb (48.5 kg)    Physical Exam Vitals reviewed.  Constitutional:      General: She is not in acute distress.    Appearance: Normal appearance. She is normal weight. She is not ill-appearing, toxic-appearing or diaphoretic.  HENT:     Head: Normocephalic and atraumatic.  Eyes:     General: No scleral icterus.       Right eye: No discharge.        Left eye: No discharge.     Conjunctiva/sclera: Conjunctivae normal.  Cardiovascular:     Rate and Rhythm: Normal rate and regular rhythm.     Heart sounds: Normal heart sounds. No murmur heard. No friction rub. No gallop.   Pulmonary:     Effort: Pulmonary effort is normal. No respiratory distress.     Breath sounds: Normal breath sounds. No stridor. No wheezing, rhonchi or rales.  Musculoskeletal:        General: Normal range of motion.     Cervical back: Normal range of motion.  Skin:    General: Skin is warm and dry.     Capillary Refill: Capillary refill takes less than 2 seconds.  Neurological:     General: No focal deficit present.     Mental Status: She is alert and oriented to person, place, and time. Mental status is at baseline.  Psychiatric:        Mood and Affect: Mood normal.        Behavior: Behavior normal.        Thought Content: Thought content normal.        Judgment: Judgment normal.     No results found for: TSH Lab Results  Component Value Date   WBC 6.0 10/22/2017   HGB 15.3 (H) 10/22/2017   HCT 48.2 (H) 10/22/2017   MCV 97.4 10/22/2017   PLT 256 10/22/2017   Lab Results  Component Value Date   NA 139 10/22/2017   K 4.0 10/22/2017   CO2  24 10/22/2017   GLUCOSE 88 10/22/2017   BUN 19 10/22/2017   CREATININE 0.90 10/22/2017   BILITOT 0.7 10/22/2017   ALKPHOS 65 10/22/2017   AST 20 10/22/2017   ALT 17 10/22/2017   PROT 8.0 10/22/2017   ALBUMIN 4.5 10/22/2017   CALCIUM 9.3  10/22/2017   ANIONGAP 9 10/22/2017   No results found for: CHOL No results found for: HDL No results found for: LDLCALC No results found for: TRIG No results found for: CHOLHDL No results found for: HGBA1C

## 2020-04-16 NOTE — Patient Instructions (Signed)
DASH Eating Plan DASH stands for Dietary Approaches to Stop Hypertension. The DASH eating plan is a healthy eating plan that has been shown to:  Reduce high blood pressure (hypertension).  Reduce your risk for type 2 diabetes, heart disease, and stroke.  Help with weight loss. What are tips for following this plan? Reading food labels  Check food labels for the amount of salt (sodium) per serving. Choose foods with less than 5 percent of the Daily Value of sodium. Generally, foods with less than 300 milligrams (mg) of sodium per serving fit into this eating plan.  To find whole grains, look for the word "whole" as the first word in the ingredient list. Shopping  Buy products labeled as "low-sodium" or "no salt added."  Buy fresh foods. Avoid canned foods and pre-made or frozen meals. Cooking  Avoid adding salt when cooking. Use salt-free seasonings or herbs instead of table salt or sea salt. Check with your health care provider or pharmacist before using salt substitutes.  Do not fry foods. Cook foods using healthy methods such as baking, boiling, grilling, roasting, and broiling instead.  Cook with heart-healthy oils, such as olive, canola, avocado, soybean, or sunflower oil. Meal planning  Eat a balanced diet that includes: ? 4 or more servings of fruits and 4 or more servings of vegetables each day. Try to fill one-half of your plate with fruits and vegetables. ? 6-8 servings of whole grains each day. ? Less than 6 oz (170 g) of lean meat, poultry, or fish each day. A 3-oz (85-g) serving of meat is about the same size as a deck of cards. One egg equals 1 oz (28 g). ? 2-3 servings of low-fat dairy each day. One serving is 1 cup (237 mL). ? 1 serving of nuts, seeds, or beans 5 times each week. ? 2-3 servings of heart-healthy fats. Healthy fats called omega-3 fatty acids are found in foods such as walnuts, flaxseeds, fortified milks, and eggs. These fats are also found in  cold-water fish, such as sardines, salmon, and mackerel.  Limit how much you eat of: ? Canned or prepackaged foods. ? Food that is high in trans fat, such as some fried foods. ? Food that is high in saturated fat, such as fatty meat. ? Desserts and other sweets, sugary drinks, and other foods with added sugar. ? Full-fat dairy products.  Do not salt foods before eating.  Do not eat more than 4 egg yolks a week.  Try to eat at least 2 vegetarian meals a week.  Eat more home-cooked food and less restaurant, buffet, and fast food.   Lifestyle  When eating at a restaurant, ask that your food be prepared with less salt or no salt, if possible.  If you drink alcohol: ? Limit how much you use to:  0-1 drink a day for women who are not pregnant.  0-2 drinks a day for men. ? Be aware of how much alcohol is in your drink. In the [place] one drink equals one 12 oz bottle of beer (355 mL), one 5 oz glass of wine (148 mL), or one 1 oz glass of hard liquor (44 mL). General information  Avoid eating more than 2,300 mg of salt a day. If you have hypertension, you may need to reduce your sodium intake to 1,500 mg a day.  Work with your health care provider to maintain a healthy body weight or to lose weight. Ask what an ideal weight is for you.    Get at least 30 minutes of exercise that causes your heart to beat faster (aerobic exercise) most days of the week. Activities may include walking, swimming, or biking.  Work with your health care provider or dietitian to adjust your eating plan to your individual calorie needs. What foods should I eat? Fruits All fresh, dried, or frozen fruit. Canned fruit in natural juice (without added sugar). Vegetables Fresh or frozen vegetables (raw, steamed, roasted, or grilled). Low-sodium or reduced-sodium tomato and vegetable juice. Low-sodium or reduced-sodium tomato sauce and tomato paste. Low-sodium or reduced-sodium canned vegetables. Grains Whole-grain  or whole-wheat bread. Whole-grain or whole-wheat pasta. Brown rice. Oatmeal. Quinoa. Bulgur. Whole-grain and low-sodium cereals. Pita bread. Low-fat, low-sodium crackers. Whole-wheat flour tortillas. Meats and other proteins Skinless chicken or turkey. Ground chicken or turkey. Pork with fat trimmed off. Fish and seafood. Egg whites. Dried beans, peas, or lentils. Unsalted nuts, nut butters, and seeds. Unsalted canned beans. Lean cuts of beef with fat trimmed off. Low-sodium, lean precooked or cured meat, such as sausages or meat loaves. Dairy Low-fat (1%) or fat-free (skim) milk. Reduced-fat, low-fat, or fat-free cheeses. Nonfat, low-sodium ricotta or cottage cheese. Low-fat or nonfat yogurt. Low-fat, low-sodium cheese. Fats and oils Soft margarine without trans fats. Vegetable oil. Reduced-fat, low-fat, or light mayonnaise and salad dressings (reduced-sodium). Canola, safflower, olive, avocado, soybean, and sunflower oils. Avocado. Seasonings and condiments Herbs. Spices. Seasoning mixes without salt. Other foods Unsalted popcorn and pretzels. Fat-free sweets. The items listed above may not be a complete list of foods and beverages you can eat. Contact a dietitian for more information. What foods should I avoid? Fruits Canned fruit in a light or heavy syrup. Fried fruit. Fruit in cream or butter sauce. Vegetables Creamed or fried vegetables. Vegetables in a cheese sauce. Regular canned vegetables (not low-sodium or reduced-sodium). Regular canned tomato sauce and paste (not low-sodium or reduced-sodium). Regular tomato and vegetable juice (not low-sodium or reduced-sodium). Pickles. Olives. Grains Baked goods made with fat, such as croissants, muffins, or some breads. Dry pasta or rice meal packs. Meats and other proteins Fatty cuts of meat. Ribs. Fried meat. Bacon. Bologna, salami, and other precooked or cured meats, such as sausages or meat loaves. Fat from the back of a pig (fatback).  Bratwurst. Salted nuts and seeds. Canned beans with added salt. Canned or smoked fish. Whole eggs or egg yolks. Chicken or turkey with skin. Dairy Whole or 2% milk, cream, and half-and-half. Whole or full-fat cream cheese. Whole-fat or sweetened yogurt. Full-fat cheese. Nondairy creamers. Whipped toppings. Processed cheese and cheese spreads. Fats and oils Butter. Stick margarine. Lard. Shortening. Ghee. Bacon fat. Tropical oils, such as coconut, palm kernel, or palm oil. Seasonings and condiments Onion salt, garlic salt, seasoned salt, table salt, and sea salt. Worcestershire sauce. Tartar sauce. Barbecue sauce. Teriyaki sauce. Soy sauce, including reduced-sodium. Steak sauce. Canned and packaged gravies. Fish sauce. Oyster sauce. Cocktail sauce. Store-bought horseradish. Ketchup. Mustard. Meat flavorings and tenderizers. Bouillon cubes. Hot sauces. Pre-made or packaged marinades. Pre-made or packaged taco seasonings. Relishes. Regular salad dressings. Other foods Salted popcorn and pretzels. The items listed above may not be a complete list of foods and beverages you should avoid. Contact a dietitian for more information. Where to find more information  National Heart, Lung, and Blood Institute: www.nhlbi.nih.gov  American Heart Association: www.heart.org  Academy of Nutrition and Dietetics: www.eatright.org  National Kidney Foundation: www.kidney.org Summary  The DASH eating plan is a healthy eating plan that has been shown to reduce high   blood pressure (hypertension). It may also reduce your risk for type 2 diabetes, heart disease, and stroke.  When on the DASH eating plan, aim to eat more fresh fruits and vegetables, whole grains, lean proteins, low-fat dairy, and heart-healthy fats.  With the DASH eating plan, you should limit salt (sodium) intake to 2,300 mg a day. If you have hypertension, you may need to reduce your sodium intake to 1,500 mg a day.  Work with your health care  provider or dietitian to adjust your eating plan to your individual calorie needs. This information is not intended to replace advice given to you by your health care provider. Make sure you discuss any questions you have with your health care provider. Document Revised: 11/29/2018 Document Reviewed: 11/29/2018 Elsevier Patient Education  2021 Elsevier Inc.   

## 2020-04-17 LAB — CBC WITH DIFFERENTIAL/PLATELET
Basophils Absolute: 0.1 10*3/uL (ref 0.0–0.2)
Basos: 1 %
EOS (ABSOLUTE): 0.2 10*3/uL (ref 0.0–0.4)
Eos: 3 %
Hematocrit: 48.9 % — ABNORMAL HIGH (ref 34.0–46.6)
Hemoglobin: 16.4 g/dL — ABNORMAL HIGH (ref 11.1–15.9)
Immature Grans (Abs): 0 10*3/uL (ref 0.0–0.1)
Lymphocytes Absolute: 3.7 10*3/uL — ABNORMAL HIGH (ref 0.7–3.1)
Lymphs: 54 %
MCH: 31.2 pg (ref 26.6–33.0)
MCV: 93 fL (ref 79–97)
Monocytes Absolute: 0.5 10*3/uL (ref 0.1–0.9)
Monocytes: 7 %
Neutrophils Absolute: 2.4 10*3/uL (ref 1.4–7.0)
Neutrophils: 35 %
Platelets: 260 10*3/uL (ref 150–450)
RBC: 5.25 x10E6/uL (ref 3.77–5.28)
RDW: 12 % (ref 11.7–15.4)
WBC: 6.9 10*3/uL (ref 3.4–10.8)

## 2020-04-17 LAB — CMP14+EGFR
ALT: 17 IU/L (ref 0–32)
AST: 21 IU/L (ref 0–40)
Albumin/Globulin Ratio: 2 (ref 1.2–2.2)
Albumin: 4.7 g/dL (ref 3.8–4.9)
Alkaline Phosphatase: 77 IU/L (ref 44–121)
BUN/Creatinine Ratio: 11 (ref 9–23)
Bilirubin Total: 0.3 mg/dL (ref 0.0–1.2)
CO2: 24 mmol/L (ref 20–29)
Calcium: 9.9 mg/dL (ref 8.7–10.2)
Globulin, Total: 2.3 g/dL (ref 1.5–4.5)
Glucose: 96 mg/dL (ref 65–99)
Potassium: 5.2 mmol/L (ref 3.5–5.2)
Sodium: 142 mmol/L (ref 134–144)
eGFR: 79 mL/min/{1.73_m2} (ref >59)

## 2020-04-17 LAB — LIPID PANEL
Chol/HDL Ratio: 3.9 ratio (ref 0.0–4.4)
Cholesterol, Total: 197 mg/dL (ref 100–199)
HDL: 51 mg/dL (ref >39)
LDL Chol Calc (NIH): 108 mg/dL — ABNORMAL HIGH (ref 0–99)
Triglycerides: 219 mg/dL — ABNORMAL HIGH (ref 0–149)
VLDL Cholesterol Cal: 38 mg/dL (ref 5–40)

## 2020-04-20 ENCOUNTER — Encounter: Payer: Self-pay | Admitting: Family Medicine

## 2020-04-20 DIAGNOSIS — E782 Mixed hyperlipidemia: Secondary | ICD-10-CM

## 2020-04-20 HISTORY — DX: Mixed hyperlipidemia: E78.2

## 2020-04-22 ENCOUNTER — Other Ambulatory Visit: Payer: Self-pay | Admitting: Family Medicine

## 2020-04-22 DIAGNOSIS — E782 Mixed hyperlipidemia: Secondary | ICD-10-CM

## 2020-04-22 MED ORDER — PRAVASTATIN SODIUM 40 MG PO TABS: 40.0000 mg | ORAL_TABLET | Freq: Every evening | ORAL | 2 refills | Status: DC

## 2020-04-24 IMAGING — DX DG FOOT COMPLETE 3+V*R*
3 series · 3 of 3 positions shown · non-contrast
Comparison: None.

CLINICAL DATA: Chronic right foot pain.  No known injury.

EXAM:
RIGHT FOOT COMPLETE - 3+ VIEW

[foot ap]
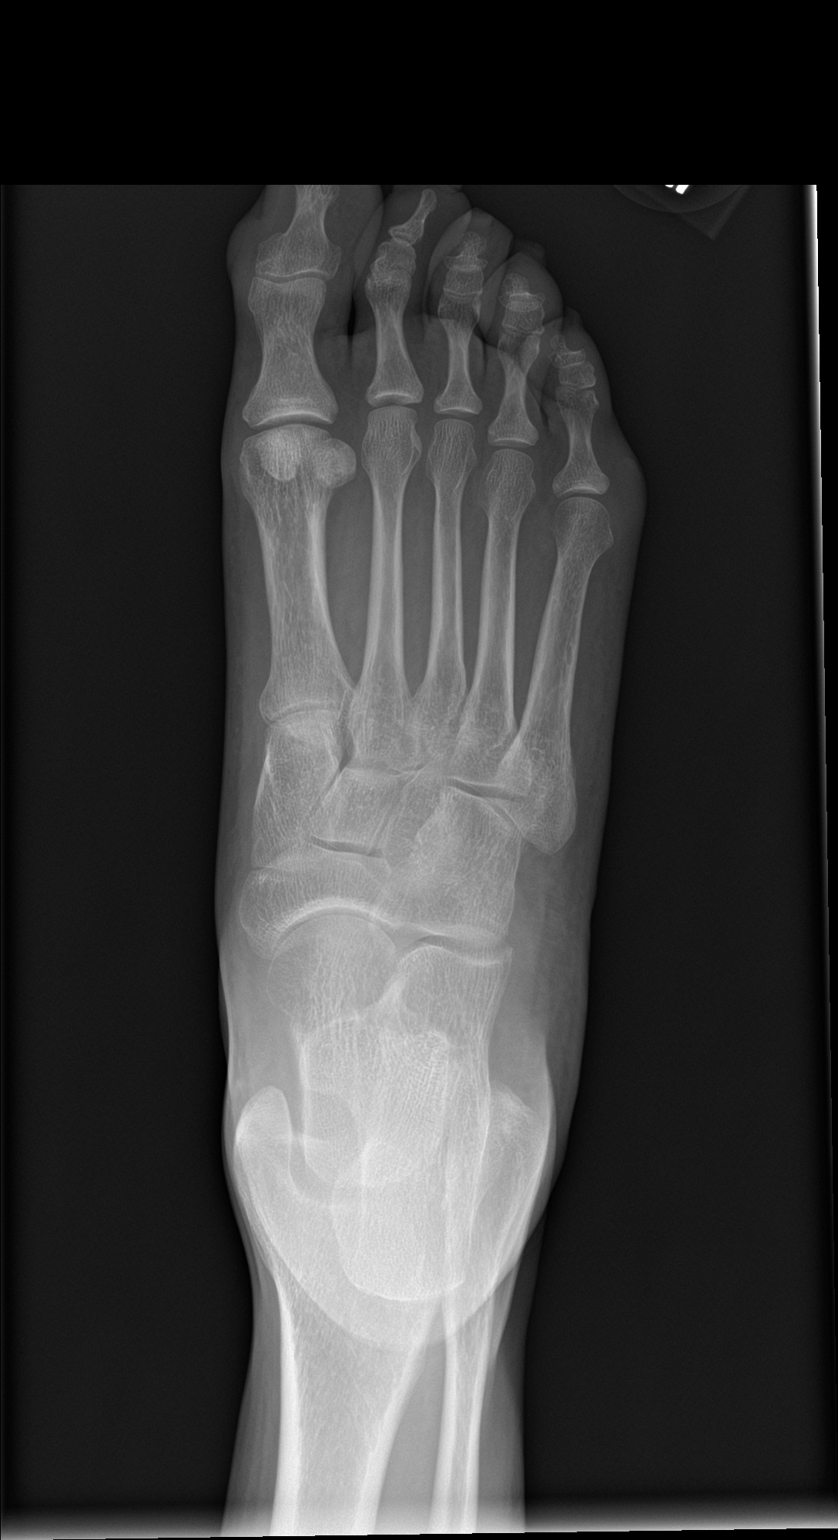

[foot obl]
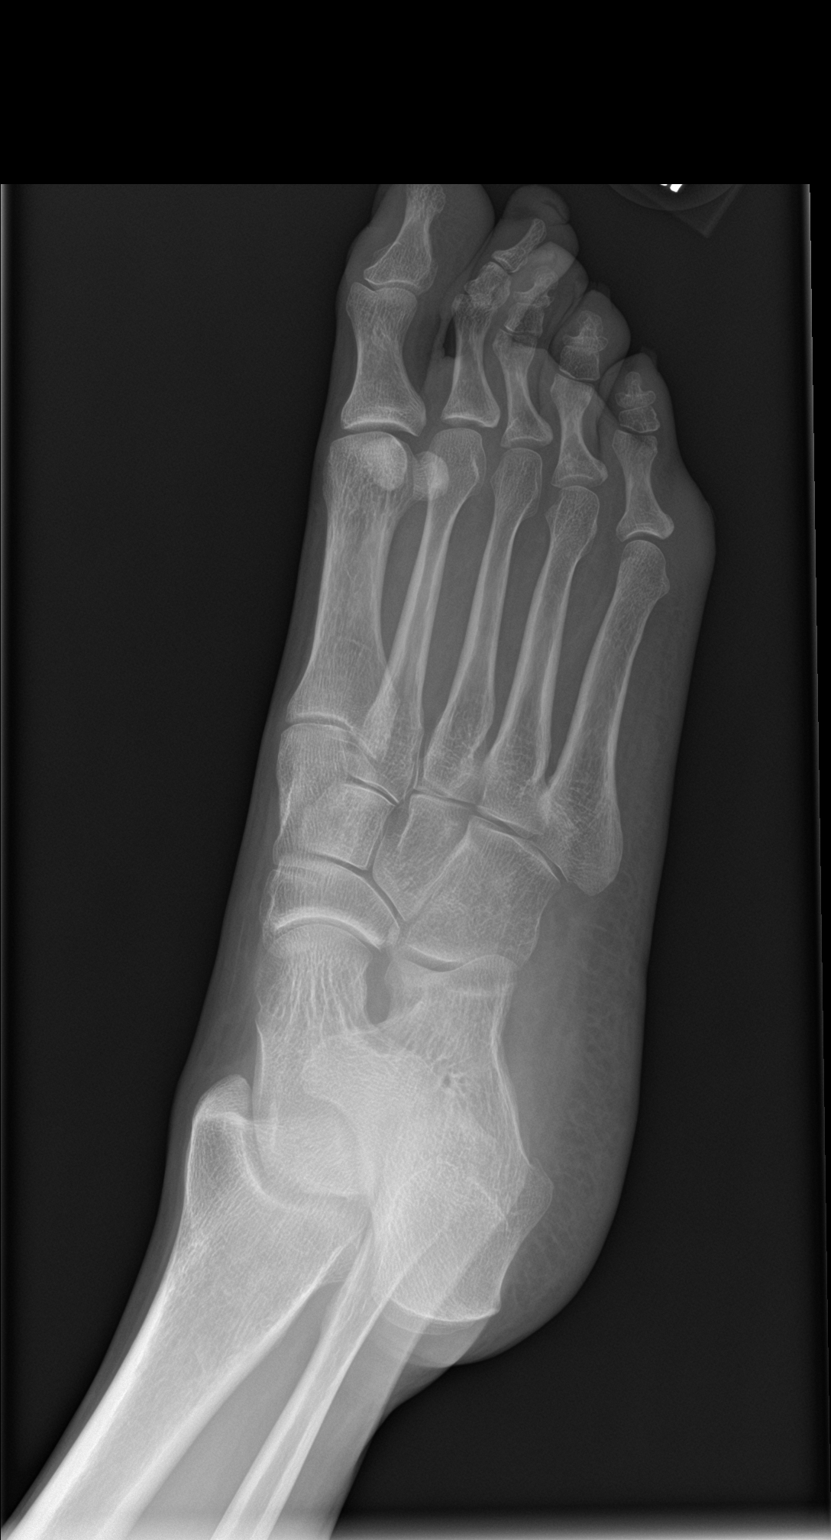

[foot lat]
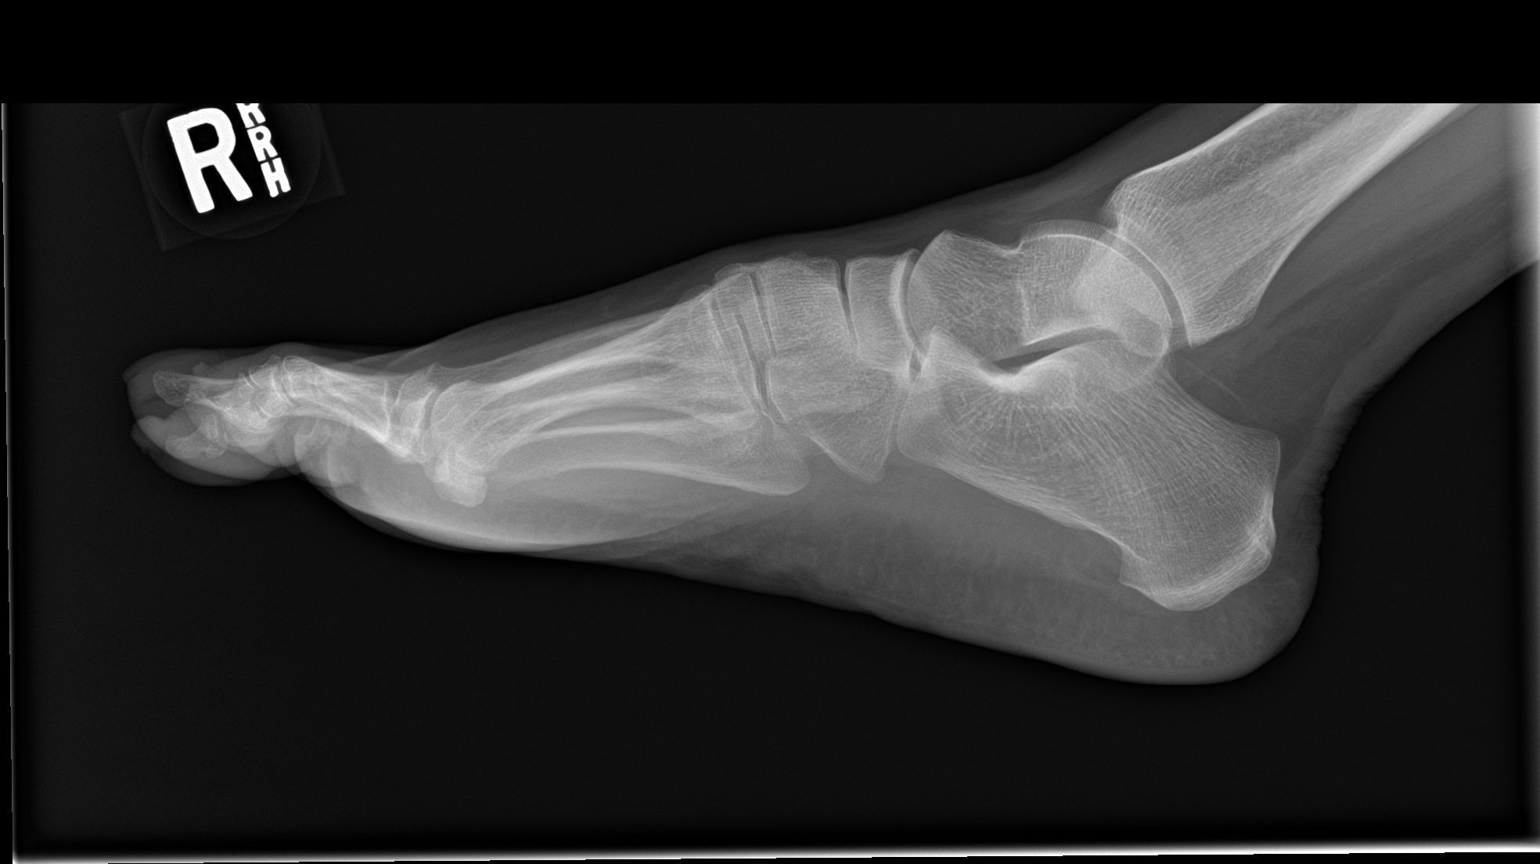

[3 of 3 positions shown; findings below may reference images not displayed]

FINDINGS: There is no evidence of fracture or dislocation. There is no
evidence of arthropathy or other focal bone abnormality. Soft
tissues are unremarkable.
IMPRESSION: Normal exam.

## 2020-04-27 ENCOUNTER — Telehealth: Payer: Self-pay

## 2020-04-27 NOTE — Telephone Encounter (Signed)
Pt called stating that she needs Britney to send her in an order to get a Humidifer to help with her allergies. Says Brayton El already knows all about her allergies. Pt uses [facility] in [place], but is unsure if they carry them there.  Please advise and call patient.

## 2020-04-27 NOTE — Telephone Encounter (Signed)
Pt aware - to try [facility] for cheapest price out of pocket

## 2020-05-07 ENCOUNTER — Telehealth: Payer: Self-pay

## 2020-05-07 NOTE — Telephone Encounter (Signed)
error 

## 2020-05-13 ENCOUNTER — Encounter: Payer: Self-pay | Admitting: Family Medicine

## 2020-05-28 ENCOUNTER — Other Ambulatory Visit: Payer: Self-pay

## 2020-05-28 ENCOUNTER — Ambulatory Visit: Payer: Medicaid Other | Admitting: Family Medicine

## 2020-05-28 ENCOUNTER — Encounter: Payer: Self-pay | Admitting: Family Medicine

## 2020-05-28 VITALS — BP 93/63 | HR 83 | Temp 97.8°F | Ht 59.0 in | Wt 102.0 lb

## 2020-05-28 DIAGNOSIS — E782 Mixed hyperlipidemia: Secondary | ICD-10-CM | POA: Diagnosis not present

## 2020-05-28 DIAGNOSIS — J302 Other seasonal allergic rhinitis: Secondary | ICD-10-CM

## 2020-05-28 DIAGNOSIS — I10 Essential (primary) hypertension: Secondary | ICD-10-CM | POA: Diagnosis not present

## 2020-05-28 DIAGNOSIS — Z1211 Encounter for screening for malignant neoplasm of colon: Secondary | ICD-10-CM

## 2020-05-28 MED ORDER — LISINOPRIL 40 MG PO TABS: 40.0000 mg | ORAL_TABLET | Freq: Every day | ORAL | 1 refills | Status: DC

## 2020-05-28 MED ORDER — AMLODIPINE BESYLATE 5 MG PO TABS: 5.0000 mg | ORAL_TABLET | Freq: Every day | ORAL | 1 refills | Status: DC

## 2020-05-28 MED ORDER — PRAVASTATIN SODIUM 40 MG PO TABS: 40.0000 mg | ORAL_TABLET | Freq: Every evening | ORAL | 1 refills | Status: DC

## 2020-05-28 NOTE — Progress Notes (Signed)
Assessment & Plan:  1. Essential hypertension Well controlled on current regimen.  - amLODipine (NORVASC) 5 MG tablet; Take 1 tablet (5 mg total) by mouth daily.  Dispense: 90 tablet; Refill: 1 - lisinopril (ZESTRIL) 40 MG tablet; Take 1 tablet (40 mg total) by mouth daily.  Dispense: 90 tablet; Refill: 1  2. Mixed hyperlipidemia Uncontrolled on recent lab work at which time pravastatin was started.  Will recheck at her next visit.  Education provided on hyperlipidemia. - pravastatin (PRAVACHOL) 40 MG tablet; Take 1 tablet (40 mg total) by mouth every evening.  Dispense: 90 tablet; Refill: 1  3. Seasonal allergies Encouraged to take Zyrtec daily.  Advised she may continue to use Flonase when she goes outside if she does not wish to use it daily.  4. Colon cancer screening - Ambulatory referral to General Surgery    No follow-ups on file.  Hendricks Limes, MSN, APRN, FNP-C [facility]  Subjective:    Patient ID: Laura Golden, female    DOB: 02-Nov-1965, 56 y.o.   MRN: [id number]  Patient Care Team: Loman Brooklyn, FNP as PCP - General (Family Medicine)   Chief Complaint:  Chief Complaint  Patient presents with  . Hypertension    6 week follow up    HPI: Laura Golden is a 55 y.o. female presenting on 05/28/2020 for Hypertension (6 week follow up)  Hypertension: Patient has been keeping a log of her blood pressure at home. Systolic ranges 646-803. Diastolic ranges 21-22 with 1/4 >90. Heart rate ranges 74-84.  Allergies: does not feel they are well controlled. Only uses Flonase and Zyrtec when she goes outside.   New complaints: None  Social history:  Relevant past medical, surgical, family and social history reviewed and updated as indicated. Interim medical history since our last visit reviewed.  Allergies and medications reviewed and updated.  DATA REVIEWED: CHART IN EPIC  ROS: Negative unless specifically indicated above in HPI.     Current Outpatient Medications:  .  acetaminophen (TYLENOL) 325 MG tablet, Take 325 mg by mouth every 6 (six) hours as needed for mild pain or headache., Disp: , Rfl:  .  amLODipine (NORVASC) 5 MG tablet, Take 1 tablet (5 mg total) by mouth daily., Disp: 30 tablet, Rfl: 2 .  aspirin 325 MG tablet, Take 325 mg by mouth daily., Disp: , Rfl:  .  cetirizine (ZYRTEC) 10 MG tablet, Take 1 tablet (10 mg total) by mouth daily., Disp: 30 tablet, Rfl: 11 .  fluticasone (FLONASE) 50 MCG/ACT nasal spray, Place 2 sprays into both nostrils daily., Disp: 16 g, Rfl: 6 .  ibuprofen (ADVIL,MOTRIN) 800 MG tablet, Take 800 mg by mouth every 8 (eight) hours as needed for fever or moderate pain., Disp: , Rfl:  .  lisinopril (ZESTRIL) 40 MG tablet, Take 1 tablet (40 mg total) by mouth daily., Disp: 90 tablet, Rfl: 1 .  pravastatin (PRAVACHOL) 40 MG tablet, Take 1 tablet (40 mg total) by mouth every evening., Disp: 30 tablet, Rfl: 2   Allergies  Allergen Reactions  . Penicillins    Past Medical History:  Diagnosis Date  . Hypertension   . Mixed hyperlipidemia 04/20/2020    Past Surgical History:  Procedure Laterality Date  . foot sx    . TOTAL ABDOMINAL HYSTERECTOMY      Social History   Socioeconomic History  . Marital status: Single    Spouse name: Not on file  . Number of children: Not on file  .  Years of education: Not on file  . Highest education level: Not on file  Occupational History  . Not on file  Tobacco Use  . Smoking status: Current Every Day Smoker    Packs/day: 0.25    Types: Cigarettes    Start date: 07/21/1981  . Smokeless tobacco: Never Used  Vaping Use  . Vaping Use: Never used  Substance and Sexual Activity  . Alcohol use: Yes    Comment: occ  . Drug use: Never  . Sexual activity: Not Currently  Other Topics Concern  . Not on file  Social History Narrative  . Not on file   Social Determinants of Health   Financial Resource Strain: Not on file  Food Insecurity:  Not on file  Transportation Needs: Not on file  Physical Activity: Not on file  Stress: Not on file  Social Connections: Not on file  Intimate Partner Violence: Not on file        Objective:    BP 93/63   Pulse 83   Temp 97.8 F (36.6 C) (Temporal)   Ht 4' 11" (1.499 m)   Wt 102 lb (46.3 kg)   SpO2 99%   BMI 20.60 kg/m   Wt Readings from Last 3 Encounters:  05/28/20 102 lb (46.3 kg)  04/16/20 104 lb 9.6 oz (47.4 kg)  11/17/19 105 lb (47.6 kg)    Physical Exam Vitals reviewed.  Constitutional:      General: She is not in acute distress.    Appearance: Normal appearance. She is normal weight. She is not ill-appearing, toxic-appearing or diaphoretic.  HENT:     Head: Normocephalic and atraumatic.  Eyes:     General: No scleral icterus.       Right eye: No discharge.        Left eye: No discharge.     Conjunctiva/sclera: Conjunctivae normal.  Cardiovascular:     Rate and Rhythm: Normal rate and regular rhythm.     Heart sounds: Normal heart sounds. No murmur heard. No friction rub. No gallop.   Pulmonary:     Effort: Pulmonary effort is normal. No respiratory distress.     Breath sounds: Normal breath sounds. No stridor. No wheezing, rhonchi or rales.  Musculoskeletal:        General: Normal range of motion.     Cervical back: Normal range of motion.  Skin:    General: Skin is warm and dry.     Capillary Refill: Capillary refill takes less than 2 seconds.  Neurological:     General: No focal deficit present.     Mental Status: She is alert and oriented to person, place, and time. Mental status is at baseline.  Psychiatric:        Mood and Affect: Mood normal.        Behavior: Behavior normal.        Thought Content: Thought content normal.        Judgment: Judgment normal.     No results found for: TSH Lab Results  Component Value Date   WBC 6.9 04/16/2020   HGB 16.4 (H) 04/16/2020   HCT 48.9 (H) 04/16/2020   MCV 93 04/16/2020   PLT 260 04/16/2020    Lab Results  Component Value Date   NA 142 04/16/2020   K 5.2 04/16/2020   CO2 24 04/16/2020   GLUCOSE 96 04/16/2020   BUN 10 04/16/2020   CREATININE 0.88 04/16/2020   BILITOT 0.3 04/16/2020   ALKPHOS 77 04/16/2020     AST 21 04/16/2020   ALT 17 04/16/2020   PROT 7.0 04/16/2020   ALBUMIN 4.7 04/16/2020   CALCIUM 9.9 04/16/2020   ANIONGAP 9 10/22/2017   EGFR 79 04/16/2020   Lab Results  Component Value Date   CHOL 197 04/16/2020   Lab Results  Component Value Date   HDL 51 04/16/2020   Lab Results  Component Value Date   LDLCALC 108 (H) 04/16/2020   Lab Results  Component Value Date   TRIG 219 (H) 04/16/2020   Lab Results  Component Value Date   CHOLHDL 3.9 04/16/2020   No results found for: HGBA1C         

## 2020-05-28 NOTE — Patient Instructions (Signed)
High Cholesterol  High cholesterol is a condition in which the blood has high levels of a white, waxy substance similar to fat (cholesterol). The liver makes all the cholesterol that the body needs. The human body needs small amounts of cholesterol to help build cells. A person gets extra or excess cholesterol from the food that he or she eats. The blood carries cholesterol from the liver to the rest of the body. If you have high cholesterol, deposits (plaques) may build up on the walls of your arteries. Arteries are the blood vessels that carry blood away from your heart. These plaques make the arteries narrow and stiff. Cholesterol plaques increase your risk for heart attack and stroke. Work with your health care provider to keep your cholesterol levels in a healthy range. What increases the risk? The following factors may make you more likely to develop this condition:  Eating foods that are high in animal fat (saturated fat) or cholesterol.  Being overweight.  Not getting enough exercise.  A family history of high cholesterol (familial hypercholesterolemia).  Use of tobacco products.  Having diabetes. What are the signs or symptoms? There are no symptoms of this condition. How is this diagnosed? This condition may be diagnosed based on the results of a blood test.  If you are older than 54 years of age, your health care provider may check your cholesterol levels every 4-6 years.  You may be checked more often if you have high cholesterol or other risk factors for heart disease. The blood test for cholesterol measures:  "Bad" cholesterol, or LDL cholesterol. This is the main type of cholesterol that causes heart disease. The desired level is less than 100 mg/dL.  "Good" cholesterol, or HDL cholesterol. HDL helps protect against heart disease by cleaning the arteries and carrying the LDL to the liver for processing. The desired level for HDL is 60 mg/dL or higher.  Triglycerides.  These are fats that your body can store or burn for energy. The desired level is less than 150 mg/dL.  Total cholesterol. This measures the total amount of cholesterol in your blood and includes LDL, HDL, and triglycerides. The desired level is less than 200 mg/dL. How is this treated? This condition may be treated with:  Diet changes. You may be asked to eat foods that have more fiber and less saturated fats or added sugar.  Lifestyle changes. These may include regular exercise, maintaining a healthy weight, and quitting use of tobacco products.  Medicines. These are given when diet and lifestyle changes have not worked. You may be prescribed a statin medicine to help lower your cholesterol levels. Follow these instructions at home: Eating and drinking  Eat a healthy, balanced diet. This diet includes: ? Daily servings of a variety of fresh, frozen, or canned fruits and vegetables. ? Daily servings of whole grain foods that are rich in fiber. ? Foods that are low in saturated fats and trans fats. These include poultry and fish without skin, lean cuts of meat, and low-fat dairy products. ? A variety of fish, especially oily fish that contain omega-3 fatty acids. Aim to eat fish at least 2 times a week.  Avoid foods and drinks that have added sugar.  Use healthy cooking methods, such as roasting, grilling, broiling, baking, poaching, steaming, and stir-frying. Do not fry your food except for stir-frying.   Lifestyle  Get regular exercise. Aim to exercise for a total of 150 minutes a week. Increase your activity level by doing activities   such as gardening, walking, and taking the stairs.  Do not use any products that contain nicotine or tobacco, such as cigarettes, e-cigarettes, and chewing tobacco. If you need help quitting, ask your health care provider.   General instructions  Take over-the-counter and prescription medicines only as told by your health care provider.  Keep all  follow-up visits as told by your health care provider. This is important. Where to find more information  American Heart Association: www.heart.org  National Heart, Lung, and Blood Institute: www.nhlbi.nih.gov Contact a health care provider if:  You have trouble achieving or maintaining a healthy diet or weight.  You are starting an exercise program.  You are unable to stop smoking. Get help right away if:  You have chest pain.  You have trouble breathing.  You have any symptoms of a stroke. "BE FAST" is an easy way to remember the main warning signs of a stroke: ? B - Balance. Signs are dizziness, sudden trouble walking, or loss of balance. ? E - Eyes. Signs are trouble seeing or a sudden change in vision. ? F - Face. Signs are sudden weakness or numbness of the face, or the face or eyelid drooping on one side. ? A - Arms. Signs are weakness or numbness in an arm. This happens suddenly and usually on one side of the body. ? S - Speech. Signs are sudden trouble speaking, slurred speech, or trouble understanding what people say. ? T - Time. Time to call emergency services. Write down what time symptoms started.  You have other signs of a stroke, such as: ? A sudden, severe headache with no known cause. ? Nausea or vomiting. ? Seizure. These symptoms may represent a serious problem that is an emergency. Do not wait to see if the symptoms will go away. Get medical help right away. Call your local emergency services (911 in the [place] Do not drive yourself to the hospital. Summary  Cholesterol plaques increase your risk for heart attack and stroke. Work with your health care provider to keep your cholesterol levels in a healthy range.  Eat a healthy, balanced diet, get regular exercise, and maintain a healthy weight.  Do not use any products that contain nicotine or tobacco, such as cigarettes, e-cigarettes, and chewing tobacco.  Get help right away if you have any symptoms of a  stroke. This information is not intended to replace advice given to you by your health care provider. Make sure you discuss any questions you have with your health care provider. Document Revised: 11/25/2018 Document Reviewed: 11/25/2018 Elsevier Patient Education  2021 Elsevier Inc.  

## 2020-06-04 ENCOUNTER — Other Ambulatory Visit: Payer: Self-pay | Admitting: Family Medicine

## 2020-06-04 DIAGNOSIS — Z1231 Encounter for screening mammogram for malignant neoplasm of breast: Secondary | ICD-10-CM

## 2020-06-11 ENCOUNTER — Ambulatory Visit (INDEPENDENT_AMBULATORY_CARE_PROVIDER_SITE_OTHER): Payer: Medicaid Other | Admitting: Podiatry

## 2020-06-11 ENCOUNTER — Other Ambulatory Visit: Payer: Self-pay

## 2020-06-11 ENCOUNTER — Encounter: Payer: Self-pay | Admitting: Podiatry

## 2020-06-11 DIAGNOSIS — M79674 Pain in right toe(s): Secondary | ICD-10-CM

## 2020-06-11 DIAGNOSIS — M216X1 Other acquired deformities of right foot: Secondary | ICD-10-CM | POA: Diagnosis not present

## 2020-06-11 DIAGNOSIS — M216X2 Other acquired deformities of left foot: Secondary | ICD-10-CM | POA: Diagnosis not present

## 2020-06-11 DIAGNOSIS — B351 Tinea unguium: Secondary | ICD-10-CM | POA: Diagnosis not present

## 2020-06-11 DIAGNOSIS — M79675 Pain in left toe(s): Secondary | ICD-10-CM

## 2020-06-15 ENCOUNTER — Telehealth: Payer: Self-pay | Admitting: *Deleted

## 2020-06-15 ENCOUNTER — Other Ambulatory Visit: Payer: Self-pay | Admitting: Family Medicine

## 2020-06-15 DIAGNOSIS — W57XXXA Bitten or stung by nonvenomous insect and other nonvenomous arthropods, initial encounter: Secondary | ICD-10-CM

## 2020-06-15 MED ORDER — TRIAMCINOLONE ACETONIDE 0.1 % EX CREA: 1.0000 "application " | TOPICAL_CREAM | Freq: Two times a day (BID) | CUTANEOUS | 1 refills | Status: DC

## 2020-06-15 NOTE — Telephone Encounter (Signed)
Fax from [facility] RF request for Triamcinolone cream 0.1% Not on curent med list Last OV 05/28/20 Nex OV 08/30/20

## 2020-06-15 NOTE — Telephone Encounter (Signed)
Please let the patient know that I sent their prescription to their pharmacy. Thanks, WS 

## 2020-06-16 NOTE — Progress Notes (Signed)
Subjective:   Patient ID: Laura Golden, female   DOB: 55 y.o.   MRN: [id number]   HPI 55 year old female presents the office today for concerns of thick, discolored toenails that she cannot trim her self.  They do become uncomfortable inside shoes.  She said no recent treatment for this.  She also gets calluses submetatarsal 5 that she points to.  She is previously had surgeries to help with this but the calluses keep coming back.  They are causing pain.  Denies any ulcerations.   Review of Systems  All other systems reviewed and are negative.  Past Medical History:  Diagnosis Date  . Hypertension   . Mixed hyperlipidemia 04/20/2020    Past Surgical History:  Procedure Laterality Date  . foot sx    . TOTAL ABDOMINAL HYSTERECTOMY       Current Outpatient Medications:  .  acetaminophen (TYLENOL) 325 MG tablet, Take 325 mg by mouth every 6 (six) hours as needed for mild pain or headache., Disp: , Rfl:  .  amLODipine (NORVASC) 5 MG tablet, Take 1 tablet (5 mg total) by mouth daily., Disp: 90 tablet, Rfl: 1 .  aspirin 325 MG tablet, Take 325 mg by mouth daily., Disp: , Rfl:  .  cetirizine (ZYRTEC) 10 MG tablet, Take 1 tablet (10 mg total) by mouth daily., Disp: 30 tablet, Rfl: 11 .  fluticasone (FLONASE) 50 MCG/ACT nasal spray, Place 2 sprays into both nostrils daily., Disp: 16 g, Rfl: 6 .  ibuprofen (ADVIL,MOTRIN) 800 MG tablet, Take 800 mg by mouth every 8 (eight) hours as needed for fever or moderate pain., Disp: , Rfl:  .  lisinopril (ZESTRIL) 40 MG tablet, Take 1 tablet (40 mg total) by mouth daily., Disp: 90 tablet, Rfl: 1 .  pravastatin (PRAVACHOL) 40 MG tablet, Take 1 tablet (40 mg total) by mouth every evening., Disp: 90 tablet, Rfl: 1 .  triamcinolone cream (KENALOG) 0.1 %, Apply 1 application topically 2 (two) times daily., Disp: 80 g, Rfl: 1  Allergies  Allergen Reactions  . Penicillins         Objective:  Physical Exam  General: AAO x3, NAD  Dermatological: Nails  are hypertrophic, dystrophic with yellow-brown discoloration x10.  Nails are causing discomfort and shoes with pressure.  Hyperkeratotic lesion submetatarsal 5 bilaterally left side worse than right without any underlying ulceration drainage or any signs of infection.  No open lesions.  Vascular: Dorsalis Pedis artery and Posterior Tibial artery pedal pulses are 2/4 bilateral with immedate capillary fill time.  There is no pain with calf compression, swelling, warmth, erythema.   Neruologic: Grossly intact via light touch bilateral.   Musculoskeletal: Tailors bunions are present with prominence of metatarsal heads plantarly.  Muscular strength 5/5 in all groups tested bilateral.  Gait: Unassisted, Nonantalgic.       Assessment:   Symptomatic onychomycosis, prominent metatarsal head    Plan:  -Treatment options discussed including all alternatives, risks, and complications -Etiology of symptoms were discussed -Sharply debrided the nails x10 without any complications or bleeding.  I sent this for culture.  This visit is patient possibly starting medication for nail fungus. -As a courtesy I debrided the calluses x2 there are any complications or bleeding.  As discussed with the prominence of the metatarsal heads.  Discussed offloading shoe modifications to prevent reoccurrence.  Vivi Barrack DPM

## 2020-06-23 ENCOUNTER — Telehealth: Payer: Self-pay | Admitting: Family Medicine

## 2020-06-23 ENCOUNTER — Ambulatory Visit: Payer: Medicaid Other | Admitting: Family Medicine

## 2020-06-23 ENCOUNTER — Encounter: Payer: Self-pay | Admitting: Family Medicine

## 2020-06-23 ENCOUNTER — Other Ambulatory Visit: Payer: Self-pay

## 2020-06-23 VITALS — BP 99/64 | HR 67 | Temp 97.9°F | Ht 59.0 in | Wt 104.2 lb

## 2020-06-23 DIAGNOSIS — R232 Flushing: Secondary | ICD-10-CM | POA: Diagnosis not present

## 2020-06-23 MED ORDER — MULTIVITAMINS PO CAPS: 1.0000 | ORAL_CAPSULE | Freq: Every day | ORAL | 1 refills | Status: DC

## 2020-06-23 NOTE — Progress Notes (Signed)
Assessment & Plan:  1. Hot flashes TSH to make sure this is not contributing to hot flashes. Discussed with patient I am not comfortable prescribing Estrace due to the stroke risk; she is an active tobacco user. Also discussed insurance is not going to cover [place] (I suspect this is what she was taking in the orange box). Will start Prozac if TSH is normal.  - TSH   Follow up plan: Return if symptoms worsen or fail to improve.  Deliah Boston, MSN, APRN, FNP-C [facility]  Subjective:   Patient ID: Laura Golden, female    DOB: [birth year], 55 y.o.   MRN: [id number]  HPI: Laura Golden is a 55 y.o. female presenting on 06/23/2020 for Hot Flashes (Every day, can't get relaxed)  Patient reports hot flashes that have been ongoing since she had a hysterectomy in her 30s. She feels they have worsened over the years. She currently states she is hot and sweating all the time. She was previously taking something over the counter "in an orange box" that cost ~$30 that worked well for her. She is unable to continue purchasing due to cost and has been getting other people to buy it for her. She states she cannot continue to do this and needs a prescription her insurance will cover.    ROS: Negative unless specifically indicated above in HPI.   Relevant past medical history reviewed and updated as indicated.   Allergies and medications reviewed and updated.   Current Outpatient Medications:    acetaminophen (TYLENOL) 325 MG tablet, Take 325 mg by mouth every 6 (six) hours as needed for mild pain or headache., Disp: , Rfl:    amLODipine (NORVASC) 5 MG tablet, Take 1 tablet (5 mg total) by mouth daily., Disp: 90 tablet, Rfl: 1   aspirin 325 MG tablet, Take 325 mg by mouth daily., Disp: , Rfl:    cetirizine (ZYRTEC) 10 MG tablet, Take 1 tablet (10 mg total) by mouth daily., Disp: 30 tablet, Rfl: 11   fluticasone (FLONASE) 50 MCG/ACT nasal spray, Place 2 sprays into both  nostrils daily., Disp: 16 g, Rfl: 6   ibuprofen (ADVIL,MOTRIN) 800 MG tablet, Take 800 mg by mouth every 8 (eight) hours as needed for fever or moderate pain., Disp: , Rfl:    lisinopril (ZESTRIL) 40 MG tablet, Take 1 tablet (40 mg total) by mouth daily., Disp: 90 tablet, Rfl: 1   pravastatin (PRAVACHOL) 40 MG tablet, Take 1 tablet (40 mg total) by mouth every evening., Disp: 90 tablet, Rfl: 1   triamcinolone cream (KENALOG) 0.1 %, Apply 1 application topically 2 (two) times daily., Disp: 80 g, Rfl: 1  Allergies  Allergen Reactions   Penicillins     Objective:   BP 99/64   Pulse 67   Temp 97.9 F (36.6 C) (Temporal)   Ht 4\' 11"  (1.499 m)   Wt 104 lb 3.2 oz (47.3 kg)   SpO2 98%   BMI 21.05 kg/m    Physical Exam Vitals reviewed.  Constitutional:      General: She is not in acute distress.    Appearance: Normal appearance. She is normal weight. She is not ill-appearing, toxic-appearing or diaphoretic.  HENT:     Head: Normocephalic and atraumatic.  Eyes:     General: No scleral icterus.       Right eye: No discharge.        Left eye: No discharge.     Conjunctiva/sclera: Conjunctivae normal.  Cardiovascular:  Rate and Rhythm: Normal rate.  Pulmonary:     Effort: Pulmonary effort is normal. No respiratory distress.  Musculoskeletal:        General: Normal range of motion.     Cervical back: Normal range of motion.  Skin:    General: Skin is warm and dry.     Capillary Refill: Capillary refill takes less than 2 seconds.  Neurological:     General: No focal deficit present.     Mental Status: She is alert and oriented to person, place, and time. Mental status is at baseline.  Psychiatric:        Mood and Affect: Mood normal.        Behavior: Behavior normal.        Thought Content: Thought content normal.        Judgment: Judgment normal.

## 2020-06-23 NOTE — Telephone Encounter (Signed)
Insurance is not going to cover this since it is over the counter.

## 2020-06-23 NOTE — Telephone Encounter (Signed)
  Prescription Request  06/23/2020  What is the name of the medication or equipment? Amberen   Have you contacted your pharmacy to request a refill? (if applicable) na  Which pharmacy would you like this sent to? [facility]   Patient notified that their request is being sent to the clinical staff for review and that they should receive a response within 2 business days.

## 2020-06-23 NOTE — Telephone Encounter (Signed)
Pt aware unable to send in script will have to continue to get OTC

## 2020-06-24 ENCOUNTER — Other Ambulatory Visit: Payer: Self-pay | Admitting: Family Medicine

## 2020-06-24 DIAGNOSIS — R232 Flushing: Secondary | ICD-10-CM

## 2020-06-24 LAB — TSH: TSH: 1.98 u[IU]/mL (ref 0.450–4.500)

## 2020-06-24 MED ORDER — FLUOXETINE HCL 20 MG PO CAPS: 20.0000 mg | ORAL_CAPSULE | Freq: Every day | ORAL | 2 refills | Status: DC

## 2020-07-13 LAB — CULT, FUNGUS, SKIN,HAIR,NAIL W/KOH
CULTURE:: NO GROWTH
SMEAR:: NONE SEEN

## 2020-07-20 ENCOUNTER — Telehealth: Payer: Self-pay | Admitting: *Deleted

## 2020-07-20 NOTE — Telephone Encounter (Signed)
Called and left a message for the patient and relayed the message per Dr Ardelle Anton and stated to call the office back if any concerns or questions. Misty Stanley

## 2020-07-20 NOTE — Telephone Encounter (Signed)
-----   Message from Vivi Barrack, DPM sent at 07/18/2020  8:46 AM EDT ----- Misty Stanley- please let her know that the culture did not show fungus. I would recommend a urea nail gel to the nails daily. If still concerned about fungus we can do the topical through [facility] to include urea and anti-fungal medication in the event of a false negative. I would not want to put her on oral medication for fungus with a normal culture.

## 2020-08-09 DIAGNOSIS — M79676 Pain in unspecified toe(s): Secondary | ICD-10-CM

## 2020-08-20 ENCOUNTER — Other Ambulatory Visit: Payer: Self-pay | Admitting: Family Medicine

## 2020-08-20 DIAGNOSIS — R232 Flushing: Secondary | ICD-10-CM

## 2020-08-30 ENCOUNTER — Encounter: Payer: Medicaid Other | Admitting: Family Medicine

## 2020-09-17 ENCOUNTER — Ambulatory Visit: Payer: Medicaid Other | Admitting: Podiatry

## 2020-09-20 ENCOUNTER — Ambulatory Visit
Admission: RE | Admit: 2020-09-20 | Discharge: 2020-09-20 | Disposition: A | Discharge status: Home / Self Care | Payer: Medicaid Other | Source: Ambulatory Visit | Attending: Family Medicine | Admitting: Family Medicine

## 2020-09-20 ENCOUNTER — Other Ambulatory Visit: Payer: Self-pay

## 2020-09-20 DIAGNOSIS — Z1231 Encounter for screening mammogram for malignant neoplasm of breast: Secondary | ICD-10-CM

## 2020-09-21 ENCOUNTER — Encounter: Payer: Self-pay | Admitting: Family Medicine

## 2020-09-21 ENCOUNTER — Ambulatory Visit: Payer: Medicaid Other | Admitting: Family Medicine

## 2020-09-21 VITALS — BP 108/68 | HR 67 | Temp 97.3°F | Ht 59.0 in | Wt 98.4 lb

## 2020-09-21 DIAGNOSIS — Z0000 Encounter for general adult medical examination without abnormal findings: Secondary | ICD-10-CM | POA: Diagnosis not present

## 2020-09-21 DIAGNOSIS — J302 Other seasonal allergic rhinitis: Secondary | ICD-10-CM | POA: Diagnosis not present

## 2020-09-21 DIAGNOSIS — Z720 Tobacco use: Secondary | ICD-10-CM

## 2020-09-21 DIAGNOSIS — R232 Flushing: Secondary | ICD-10-CM | POA: Diagnosis not present

## 2020-09-21 DIAGNOSIS — K029 Dental caries, unspecified: Secondary | ICD-10-CM | POA: Diagnosis not present

## 2020-09-21 DIAGNOSIS — Z0001 Encounter for general adult medical examination with abnormal findings: Secondary | ICD-10-CM | POA: Diagnosis not present

## 2020-09-21 DIAGNOSIS — Z23 Encounter for immunization: Secondary | ICD-10-CM

## 2020-09-21 DIAGNOSIS — E782 Mixed hyperlipidemia: Secondary | ICD-10-CM | POA: Diagnosis not present

## 2020-09-21 DIAGNOSIS — I10 Essential (primary) hypertension: Secondary | ICD-10-CM | POA: Diagnosis not present

## 2020-09-21 MED ORDER — AMLODIPINE BESYLATE 5 MG PO TABS: 5.0000 mg | ORAL_TABLET | Freq: Every day | ORAL | 1 refills | Status: DC

## 2020-09-21 MED ORDER — PRAVASTATIN SODIUM 40 MG PO TABS: 40.0000 mg | ORAL_TABLET | Freq: Every evening | ORAL | 1 refills | Status: DC

## 2020-09-21 MED ORDER — CETIRIZINE HCL 10 MG PO TABS: 10.0000 mg | ORAL_TABLET | Freq: Every day | ORAL | 1 refills | Status: DC

## 2020-09-21 MED ORDER — FLUOXETINE HCL 20 MG PO CAPS: 20.0000 mg | ORAL_CAPSULE | Freq: Every day | ORAL | 1 refills | Status: DC

## 2020-09-21 MED ORDER — LISINOPRIL 40 MG PO TABS: 40.0000 mg | ORAL_TABLET | Freq: Every day | ORAL | 1 refills | Status: DC

## 2020-09-21 NOTE — Patient Instructions (Signed)
Reminder: please return after mid-September for your flu shot. If you receive this elsewhere, such as your pharmacy, please let us know so we can get this documented in your chart. We have flu clinic days scheduled during the month of October, if you would like to go ahead and schedule for this.   Dr. Marcha Solders [phone] - call about scheduling a colonoscopy.

## 2020-09-21 NOTE — Progress Notes (Signed)
Assessment & Plan:  1. Well adult exam Preventive health education provided. Number provided to call and get colonoscopy scheduled. - CBC with Differential/Platelet - CMP14+EGFR - Lipid panel  2. Essential hypertension Well controlled on current regimen.  - amLODipine (NORVASC) 5 MG tablet; Take 1 tablet (5 mg total) by mouth daily.  Dispense: 90 tablet; Refill: 1 - lisinopril (ZESTRIL) 40 MG tablet; Take 1 tablet (40 mg total) by mouth daily.  Dispense: 90 tablet; Refill: 1 - CBC with Differential/Platelet - CMP14+EGFR - Lipid panel  3. Mixed hyperlipidemia Labs to assess. - pravastatin (PRAVACHOL) 40 MG tablet; Take 1 tablet (40 mg total) by mouth every evening.  Dispense: 90 tablet; Refill: 1 - CMP14+EGFR - Lipid panel  4. Hot flashes Well controlled on current regimen.  - FLUoxetine (PROZAC) 20 MG capsule; Take 1 capsule (20 mg total) by mouth daily.  Dispense: 90 capsule; Refill: 1 - CMP14+EGFR  5. Seasonal allergies Well controlled on current regimen.  - cetirizine (ZYRTEC) 10 MG tablet; Take 1 tablet (10 mg total) by mouth daily.  Dispense: 90 tablet; Refill: 1  6. Tobacco abuse Encouraged smoking cessation. - CBC with Differential/Platelet - CMP14+EGFR - Lipid panel - Pneumococcal conjugate vaccine 20-valent (Prevnar 20)  7. Dental caries - Ambulatory referral to Dentistry  8. Immunization due - Pneumococcal conjugate vaccine 20-valent (Prevnar 20) - given in office today   Follow-up: Return in about 6 months (around 03/21/2021) for follow-up of chronic medication conditions.   Laura Limes, MSN, APRN, FNP-C [facility]  Subjective:  Patient ID: Laura Golden, female    DOB: 04-05-1965  Age: 55 y.o. MRN: [id number]  Patient Care Team: Laura Brooklyn, FNP as PCP - General (Family Medicine)   CC:  Chief Complaint  Patient presents with   Annual Exam    HPI Laura Golden presents for her annual physical.   Occupation:  disability, Marital status: single, Substance use: none Diet: Regular, Exercise: walking Last eye exam: upcoming on 10/06/2020 Last dental exam: Never Last colonoscopy: Never Last mammogram: 09/20/2020 Last pap smear: no longer needed due to complete hysterectomy Hepatitis C Screening: Never Immunizations:  Influenza vaccine: declined Tdap Vaccine: up to date  Shingrix Vaccine: declined  COVID-19 Vaccine: up to date Pneumonia Vaccine: agreeable in getting today  DEPRESSION SCREENING PHQ 2/9 Scores 09/21/2020 06/23/2020 05/28/2020 04/16/2020 11/17/2019 10/31/2019 07/22/2019  PHQ - 2 Score '1 1 3 ' 0 0 0 0  PHQ- 9 Score '4 6 3 ' - - - -    Hot flashes: much improved with the addition on Prozac.   Review of Systems  Constitutional:  Negative for chills, fever, malaise/fatigue and weight loss.  HENT:  Negative for congestion, ear discharge, ear pain, nosebleeds, sinus pain, sore throat and tinnitus.        Mouth pain left bottom due to a whole in her tooth.  Eyes:  Negative for blurred vision, double vision, pain, discharge and redness.       Poor vision.  Respiratory:  Negative for cough, shortness of breath and wheezing.   Cardiovascular:  Negative for chest pain, palpitations and leg swelling.  Gastrointestinal:  Negative for abdominal pain, constipation, diarrhea, heartburn, nausea and vomiting.  Genitourinary:  Negative for dysuria, frequency and urgency.  Musculoskeletal:  Negative for myalgias.  Skin:  Negative for rash.  Neurological:  Negative for dizziness, seizures, weakness and headaches.  Psychiatric/Behavioral:  Negative for depression, substance abuse and suicidal ideas. The patient is not nervous/anxious.  Current Outpatient Medications:    acetaminophen (TYLENOL) 325 MG tablet, Take 325 mg by mouth every 6 (six) hours as needed for mild pain or headache., Disp: , Rfl:    aspirin 325 MG tablet, Take 325 mg by mouth daily., Disp: , Rfl:    fluticasone (FLONASE) 50 MCG/ACT  nasal spray, Place 2 sprays into both nostrils daily., Disp: 16 g, Rfl: 6   ibuprofen (ADVIL,MOTRIN) 800 MG tablet, Take 800 mg by mouth every 8 (eight) hours as needed for fever or moderate pain., Disp: , Rfl:    Multiple Vitamin (MULTIVITAMIN) capsule, Take 1 capsule by mouth daily., Disp: 90 capsule, Rfl: 1   triamcinolone cream (KENALOG) 0.1 %, Apply 1 application topically 2 (two) times daily., Disp: 80 g, Rfl: 1   amLODipine (NORVASC) 5 MG tablet, Take 1 tablet (5 mg total) by mouth daily., Disp: 90 tablet, Rfl: 1   cetirizine (ZYRTEC) 10 MG tablet, Take 1 tablet (10 mg total) by mouth daily., Disp: 90 tablet, Rfl: 1   FLUoxetine (PROZAC) 20 MG capsule, Take 1 capsule (20 mg total) by mouth daily., Disp: 90 capsule, Rfl: 1   lisinopril (ZESTRIL) 40 MG tablet, Take 1 tablet (40 mg total) by mouth daily., Disp: 90 tablet, Rfl: 1   pravastatin (PRAVACHOL) 40 MG tablet, Take 1 tablet (40 mg total) by mouth every evening., Disp: 90 tablet, Rfl: 1  Allergies  Allergen Reactions   Penicillins     Past Medical History:  Diagnosis Date   Hypertension    Mixed hyperlipidemia 04/20/2020    Past Surgical History:  Procedure Laterality Date   foot sx     TOTAL ABDOMINAL HYSTERECTOMY      Family History  Problem Relation Age of Onset   Diabetes Mother    Heart disease Mother    Hypertension Mother    Liver disease Father    Hypertension Sister    Asthma Daughter    Allergies Daughter    Hyperlipidemia Brother    Breast cancer Other     Social History   Socioeconomic History   Marital status: Single    Spouse name: Not on file   Number of children: Not on file   Years of education: Not on file   Highest education level: Not on file  Occupational History   Not on file  Tobacco Use   Smoking status: Every Day    Packs/day: 0.25    Types: Cigarettes    Start date: 07/21/1981   Smokeless tobacco: Never  Vaping Use   Vaping Use: Never used  Substance and Sexual Activity    Alcohol use: Yes    Comment: occ   Drug use: Never   Sexual activity: Not Currently  Other Topics Concern   Not on file  Social History Narrative   Not on file   Social Determinants of Health   Financial Resource Strain: Not on file  Food Insecurity: Not on file  Transportation Needs: Not on file  Physical Activity: Not on file  Stress: Not on file  Social Connections: Not on file  Intimate Partner Violence: Not on file      Objective:    BP 108/68   Pulse 67   Temp (!) 97.3 F (36.3 C) (Temporal)   Ht '4\' 11"'  (1.499 m)   Wt 98 lb 6.4 oz (44.6 kg)   BMI 19.87 kg/m   Wt Readings from Last 3 Encounters:  09/21/20 98 lb 6.4 oz (44.6 kg)  06/23/20 104 lb  3.2 oz (47.3 kg)  05/28/20 102 lb (46.3 kg)    Physical Exam Vitals reviewed.  Constitutional:      General: She is not in acute distress.    Appearance: Normal appearance. She is underweight. She is not ill-appearing, toxic-appearing or diaphoretic.  HENT:     Head: Normocephalic and atraumatic.     Right Ear: Tympanic membrane, ear canal and external ear normal. There is no impacted cerumen.     Left Ear: Tympanic membrane, ear canal and external ear normal. There is no impacted cerumen.     Nose: Nose normal. No congestion or rhinorrhea.     Mouth/Throat:     Mouth: Mucous membranes are moist.     Dentition: Abnormal dentition. Dental tenderness and dental caries present. No gingival swelling, dental abscesses or gum lesions.     Pharynx: Oropharynx is clear. No oropharyngeal exudate or posterior oropharyngeal erythema.  Eyes:     General: No scleral icterus.       Right eye: No discharge.        Left eye: No discharge.     Conjunctiva/sclera: Conjunctivae normal.     Pupils: Pupils are equal, round, and reactive to light.  Cardiovascular:     Rate and Rhythm: Normal rate and regular rhythm.     Heart sounds: Normal heart sounds. No murmur heard.   No friction rub. No gallop.  Pulmonary:     Effort:  Pulmonary effort is normal. No respiratory distress.     Breath sounds: Normal breath sounds. No stridor. No wheezing, rhonchi or rales.  Abdominal:     General: Abdomen is flat. Bowel sounds are normal. There is no distension.     Palpations: Abdomen is soft. There is no hepatomegaly, splenomegaly or mass.     Tenderness: There is no abdominal tenderness. There is no guarding or rebound.     Hernia: No hernia is present.  Musculoskeletal:        General: Normal range of motion.     Cervical back: Normal range of motion and neck supple. No rigidity. No muscular tenderness.  Lymphadenopathy:     Cervical: No cervical adenopathy.  Skin:    General: Skin is warm and dry.     Capillary Refill: Capillary refill takes less than 2 seconds.  Neurological:     General: No focal deficit present.     Mental Status: She is alert and oriented to person, place, and time. Mental status is at baseline.  Psychiatric:        Mood and Affect: Mood normal.        Behavior: Behavior normal.        Thought Content: Thought content normal.        Judgment: Judgment normal.    Lab Results  Component Value Date   TSH 1.980 06/23/2020   Lab Results  Component Value Date   WBC 6.9 04/16/2020   HGB 16.4 (H) 04/16/2020   HCT 48.9 (H) 04/16/2020   MCV 93 04/16/2020   PLT 260 04/16/2020   Lab Results  Component Value Date   NA 142 04/16/2020   K 5.2 04/16/2020   CO2 24 04/16/2020   GLUCOSE 96 04/16/2020   BUN 10 04/16/2020   CREATININE 0.88 04/16/2020   BILITOT 0.3 04/16/2020   ALKPHOS 77 04/16/2020   AST 21 04/16/2020   ALT 17 04/16/2020   PROT 7.0 04/16/2020   ALBUMIN 4.7 04/16/2020   CALCIUM 9.9 04/16/2020   ANIONGAP 9 10/22/2017  EGFR 79 04/16/2020   Lab Results  Component Value Date   CHOL 197 04/16/2020   Lab Results  Component Value Date   HDL 51 04/16/2020   Lab Results  Component Value Date   LDLCALC 108 (H) 04/16/2020   Lab Results  Component Value Date   TRIG 219 (H)  04/16/2020   Lab Results  Component Value Date   CHOLHDL 3.9 04/16/2020   No results found for: HGBA1C

## 2020-09-22 ENCOUNTER — Encounter: Payer: Self-pay | Admitting: Family Medicine

## 2020-09-22 LAB — CBC WITH DIFFERENTIAL/PLATELET
Basophils Absolute: 0 10*3/uL (ref 0.0–0.2)
Basos: 1 %
EOS (ABSOLUTE): 0.1 10*3/uL (ref 0.0–0.4)
Eos: 2 %
Hematocrit: 45.5 % (ref 34.0–46.6)
Hemoglobin: 14.6 g/dL (ref 11.1–15.9)
Immature Grans (Abs): 0 10*3/uL (ref 0.0–0.1)
Immature Granulocytes: 0 %
Lymphocytes Absolute: 3.3 10*3/uL — ABNORMAL HIGH (ref 0.7–3.1)
Lymphs: 47 %
MCH: 30.5 pg (ref 26.6–33.0)
MCHC: 32.1 g/dL (ref 31.5–35.7)
MCV: 95 fL (ref 79–97)
Monocytes Absolute: 0.4 10*3/uL (ref 0.1–0.9)
Monocytes: 6 %
Neutrophils Absolute: 3.1 10*3/uL (ref 1.4–7.0)
Neutrophils: 44 %
Platelets: 256 10*3/uL (ref 150–450)
RBC: 4.78 x10E6/uL (ref 3.77–5.28)
RDW: 12.3 % (ref 11.7–15.4)
WBC: 7 10*3/uL (ref 3.4–10.8)

## 2020-09-22 LAB — LIPID PANEL
Chol/HDL Ratio: 2.5 ratio (ref 0.0–4.4)
Cholesterol, Total: 149 mg/dL (ref 100–199)
HDL: 60 mg/dL (ref >39)
LDL Chol Calc (NIH): 70 mg/dL (ref 0–99)
Triglycerides: 107 mg/dL (ref 0–149)
VLDL Cholesterol Cal: 19 mg/dL (ref 5–40)

## 2020-09-22 LAB — CMP14+EGFR
ALT: 21 IU/L (ref 0–32)
AST: 27 IU/L (ref 0–40)
Albumin/Globulin Ratio: 1.9 (ref 1.2–2.2)
Albumin: 4.5 g/dL (ref 3.8–4.9)
Alkaline Phosphatase: 107 IU/L (ref 44–121)
BUN/Creatinine Ratio: 17 (ref 9–23)
BUN: 14 mg/dL (ref 6–24)
Bilirubin Total: 0.2 mg/dL (ref 0.0–1.2)
CO2: 25 mmol/L (ref 20–29)
Calcium: 9.3 mg/dL (ref 8.7–10.2)
Chloride: 105 mmol/L (ref 96–106)
Creatinine, Ser: 0.82 mg/dL (ref 0.57–1.00)
Globulin, Total: 2.4 g/dL (ref 1.5–4.5)
Glucose: 97 mg/dL (ref 65–99)
Potassium: 4.4 mmol/L (ref 3.5–5.2)
Sodium: 142 mmol/L (ref 134–144)
Total Protein: 6.9 g/dL (ref 6.0–8.5)
eGFR: 85 mL/min/{1.73_m2} (ref >59)

## 2020-09-24 ENCOUNTER — Encounter: Payer: Self-pay | Admitting: Family Medicine

## 2020-09-24 DIAGNOSIS — R232 Flushing: Secondary | ICD-10-CM | POA: Insufficient documentation

## 2020-09-27 ENCOUNTER — Other Ambulatory Visit: Payer: Self-pay | Admitting: Family Medicine

## 2020-09-27 ENCOUNTER — Other Ambulatory Visit: Payer: Self-pay | Admitting: Occupational Therapy

## 2020-09-27 DIAGNOSIS — R928 Other abnormal and inconclusive findings on diagnostic imaging of breast: Secondary | ICD-10-CM

## 2020-10-11 DIAGNOSIS — L02411 Cutaneous abscess of right axilla: Secondary | ICD-10-CM | POA: Diagnosis not present

## 2020-10-12 DIAGNOSIS — H5213 Myopia, bilateral: Secondary | ICD-10-CM | POA: Diagnosis not present

## 2020-10-14 ENCOUNTER — Encounter: Payer: Self-pay | Admitting: Podiatry

## 2020-10-14 ENCOUNTER — Ambulatory Visit (INDEPENDENT_AMBULATORY_CARE_PROVIDER_SITE_OTHER): Payer: Medicaid Other | Admitting: Podiatry

## 2020-10-14 ENCOUNTER — Other Ambulatory Visit: Payer: Self-pay

## 2020-10-14 DIAGNOSIS — B353 Tinea pedis: Secondary | ICD-10-CM

## 2020-10-14 DIAGNOSIS — M79674 Pain in right toe(s): Secondary | ICD-10-CM

## 2020-10-14 DIAGNOSIS — B351 Tinea unguium: Secondary | ICD-10-CM

## 2020-10-14 DIAGNOSIS — M79675 Pain in left toe(s): Secondary | ICD-10-CM | POA: Diagnosis not present

## 2020-10-14 DIAGNOSIS — M216X1 Other acquired deformities of right foot: Secondary | ICD-10-CM

## 2020-10-14 MED ORDER — KETOCONAZOLE 2 % EX CREA: 1.0000 "application " | TOPICAL_CREAM | Freq: Every day | CUTANEOUS | 2 refills | Status: DC

## 2020-10-14 NOTE — Progress Notes (Signed)
  Subjective:  Patient ID: Laura Golden, female    DOB: Jun 07, 1965,   MRN: [id number]  Chief Complaint  Patient presents with   Nail Problem    Routine foot care Nail trim 1-5 bilateral Callus debridment     55 y.o. female presents for routine nail care and callus debridement. Also here to discuss results of fungal culture which were found to be negative for any fungus . Denies any other pedal complaints. Denies n/v/f/c.   Past Medical History:  Diagnosis Date   Hypertension    Mixed hyperlipidemia 04/20/2020    Objective:  Physical Exam: Vascular: DP/PT pulses 2/4 bilateral. CFT <3 seconds. Normal hair growth on digits. No edema.  Skin. No lacerations or abrasions bilateral feet. Scaling lesion noted bilateral plantar feet. Hyperkeratosis noted bilateral 5th metatarsal heads and medial hallux bilateral. Nails 1-5 are thickened discolored and elongated with subungual debris.  Musculoskeletal: MMT 5/5 bilateral lower extremities in DF, PF, Inversion and Eversion. Deceased ROM in DF of ankle joint.  Neurological: Sensation intact to light touch.   Assessment:   1. Dermatophytosis of nail   2. Tinea pedis of both feet   3. Prominent metatarsal head, right      Plan:  Patient was evaluated and treated and all questions answered. -Mechanically debrided all nails 1-5 bilateral using sterile nail nipper and filed with dremel without incident  Trimmed calluses as courtesy today.  -Discussed results of fungal nail culture. Advised patient they were negative and maybe able to try urea nail gel to help soften the nails.  -Prescription for ketoconazole provided for tinea pedis.  -Answered all patient questions -Patient to return as needed for routine foot care.  -Patient advised to call the office if any problems or questions arise in the meantime.   Louann Sjogren, DPM

## 2020-10-22 ENCOUNTER — Telehealth: Payer: Self-pay | Admitting: Family Medicine

## 2020-10-22 NOTE — Telephone Encounter (Signed)
[name] was placed at [facility] pharmacy

## 2020-10-27 ENCOUNTER — Telehealth: Payer: Self-pay | Admitting: Family Medicine

## 2020-10-27 NOTE — Telephone Encounter (Signed)
Faxed med list  

## 2020-11-01 ENCOUNTER — Telehealth: Payer: Self-pay | Admitting: *Deleted

## 2020-11-01 DIAGNOSIS — E782 Mixed hyperlipidemia: Secondary | ICD-10-CM

## 2020-11-01 DIAGNOSIS — J302 Other seasonal allergic rhinitis: Secondary | ICD-10-CM

## 2020-11-01 DIAGNOSIS — I10 Essential (primary) hypertension: Secondary | ICD-10-CM

## 2020-11-01 DIAGNOSIS — R232 Flushing: Secondary | ICD-10-CM

## 2020-11-01 MED ORDER — PRAVASTATIN SODIUM 40 MG PO TABS: 40.0000 mg | ORAL_TABLET | Freq: Every evening | ORAL | 0 refills | Status: DC

## 2020-11-01 MED ORDER — LISINOPRIL 40 MG PO TABS: 40.0000 mg | ORAL_TABLET | Freq: Every day | ORAL | 0 refills | Status: DC

## 2020-11-01 MED ORDER — CETIRIZINE HCL 10 MG PO TABS: 10.0000 mg | ORAL_TABLET | Freq: Every day | ORAL | 0 refills | Status: DC

## 2020-11-01 MED ORDER — AMLODIPINE BESYLATE 5 MG PO TABS: 5.0000 mg | ORAL_TABLET | Freq: Every day | ORAL | 0 refills | Status: DC

## 2020-11-01 MED ORDER — FLUOXETINE HCL 20 MG PO CAPS: 20.0000 mg | ORAL_CAPSULE | Freq: Every day | ORAL | 0 refills | Status: DC

## 2020-11-01 NOTE — Telephone Encounter (Addendum)
TC to pt to verify that she does want her Rxs to go to [facility], they have called again to send all of her meds to them. She said she is going to try them out. Sending the refill on her meds.

## 2020-11-09 ENCOUNTER — Other Ambulatory Visit: Payer: Medicaid Other

## 2020-11-15 ENCOUNTER — Other Ambulatory Visit: Payer: Medicaid Other

## 2021-01-05 DIAGNOSIS — R922 Inconclusive mammogram: Secondary | ICD-10-CM | POA: Diagnosis not present

## 2021-02-23 DIAGNOSIS — M79676 Pain in unspecified toe(s): Secondary | ICD-10-CM

## 2021-02-27 ENCOUNTER — Other Ambulatory Visit: Payer: Self-pay | Admitting: Family Medicine

## 2021-02-27 DIAGNOSIS — E782 Mixed hyperlipidemia: Secondary | ICD-10-CM

## 2021-02-27 DIAGNOSIS — I10 Essential (primary) hypertension: Secondary | ICD-10-CM

## 2021-02-27 DIAGNOSIS — R232 Flushing: Secondary | ICD-10-CM

## 2021-03-22 ENCOUNTER — Ambulatory Visit: Payer: Medicaid Other | Admitting: Family Medicine

## 2021-03-22 ENCOUNTER — Encounter: Payer: Self-pay | Admitting: Family Medicine

## 2021-03-22 VITALS — BP 103/65 | HR 79 | Temp 97.2°F | Ht 59.0 in | Wt 104.4 lb

## 2021-03-22 DIAGNOSIS — Z1211 Encounter for screening for malignant neoplasm of colon: Secondary | ICD-10-CM

## 2021-03-22 DIAGNOSIS — Z720 Tobacco use: Secondary | ICD-10-CM | POA: Diagnosis not present

## 2021-03-22 DIAGNOSIS — L309 Dermatitis, unspecified: Secondary | ICD-10-CM

## 2021-03-22 DIAGNOSIS — R232 Flushing: Secondary | ICD-10-CM

## 2021-03-22 DIAGNOSIS — E782 Mixed hyperlipidemia: Secondary | ICD-10-CM

## 2021-03-22 DIAGNOSIS — I10 Essential (primary) hypertension: Secondary | ICD-10-CM

## 2021-03-22 DIAGNOSIS — J302 Other seasonal allergic rhinitis: Secondary | ICD-10-CM | POA: Diagnosis not present

## 2021-03-22 MED ORDER — TRIAMCINOLONE ACETONIDE 0.1 % EX CREA: 1.0000 "application " | TOPICAL_CREAM | Freq: Two times a day (BID) | CUTANEOUS | 1 refills | Status: AC

## 2021-03-22 MED ORDER — LISINOPRIL 40 MG PO TABS: 40.0000 mg | ORAL_TABLET | Freq: Every day | ORAL | 1 refills | Status: DC

## 2021-03-22 MED ORDER — FLUTICASONE PROPIONATE 50 MCG/ACT NA SUSP: 2.0000 | Freq: Every day | NASAL | 6 refills | Status: DC

## 2021-03-22 MED ORDER — FLUOXETINE HCL 20 MG PO CAPS: 20.0000 mg | ORAL_CAPSULE | Freq: Every day | ORAL | 1 refills | Status: DC

## 2021-03-22 MED ORDER — PRAVASTATIN SODIUM 40 MG PO TABS: 40.0000 mg | ORAL_TABLET | Freq: Every evening | ORAL | 1 refills | Status: DC

## 2021-03-22 MED ORDER — AMLODIPINE BESYLATE 5 MG PO TABS: 5.0000 mg | ORAL_TABLET | Freq: Every day | ORAL | 1 refills | Status: DC

## 2021-03-22 NOTE — Patient Instructions (Signed)
You are due for your 2nd shingles vaccine on 03/24/2021 - please get this in the next couple of months.  ?

## 2021-03-22 NOTE — Progress Notes (Signed)
? ?Assessment & Plan:  ?1. Essential hypertension ?Well controlled on current regimen.  ?- amLODipine (NORVASC) 5 MG tablet; Take 1 tablet (5 mg total) by mouth daily.  Dispense: 90 tablet; Refill: 1 ?- lisinopril (ZESTRIL) 40 MG tablet; Take 1 tablet (40 mg total) by mouth daily.  Dispense: 90 tablet; Refill: 1 ? ?2. Mixed hyperlipidemia ?Well controlled on current regimen.  ?- pravastatin (PRAVACHOL) 40 MG tablet; Take 1 tablet (40 mg total) by mouth every evening.  Dispense: 90 tablet; Refill: 1 ? ?3. Hot flashes ?Well controlled on current regimen.  ?- FLUoxetine (PROZAC) 20 MG capsule; Take 1 capsule (20 mg total) by mouth daily.  Dispense: 90 capsule; Refill: 1 ? ?4. Seasonal allergies ?Well controlled on current regimen.  ?- fluticasone (FLONASE) 50 MCG/ACT nasal spray; Place 2 sprays into both nostrils daily.  Dispense: 16 g; Refill: 6 ? ?5. Tobacco abuse ?Encouraged smoking cessation. Patient is not ready to quit. ? ?6. Dermatitis ?Well controlled on current regimen.  ?- triamcinolone cream (KENALOG) 0.1 %; Apply 1 application. topically 2 (two) times daily.  Dispense: 80 g; Refill: 1 ? ?7. Colon cancer screening ?- Cologuard ? ? ?Return in about 6 months (around 09/22/2021) for annual physical. ? ?Hendricks Limes, MSN, APRN, FNP-C ?[place] ? ?Subjective:  ? ? Patient ID: Laura Golden, female    DOB: 1966/01/08, 56 y.o.   MRN: [id number] ? ?Patient Care Team: ?Loman Brooklyn, FNP as PCP - General (Family Medicine)  ? ?Chief Complaint:  ?Chief Complaint  ?Patient presents with  ? Medical Management of Chronic Issues  ? ? ?HPI: ?Laura Golden is a 56 y.o. female presenting on 03/22/2021 for Medical Management of Chronic Issues ? ?Hypertension: Patient has been checking her blood pressure at home and reports good readings. ? ?Hyperlipidemia: taking pravastatin daily.  ? ?Hot flashes: much improved with Prozac. ? ?Allergies: uses Flonase and Zyrtec as needed. States she will start taking  it regularly in the summer when her symptoms return. ? ?New complaints: ?None ? ? ?Social history: ? ?Relevant past medical, surgical, family and social history reviewed and updated as indicated. Interim medical history since our last visit reviewed. ? ?Allergies and medications reviewed and updated. ? ?DATA REVIEWED: CHART IN EPIC ? ?ROS: Negative unless specifically indicated above in HPI.  ? ? ?Current Outpatient Medications:  ?  acetaminophen (TYLENOL) 325 MG tablet, Take 325 mg by mouth every 6 (six) hours as needed for mild pain or headache., Disp: , Rfl:  ?  amLODipine (NORVASC) 5 MG tablet, TAKE 1 TABLET DAILY, Disp: 30 tablet, Rfl: 0 ?  cetirizine (ZYRTEC) 10 MG tablet, Take 1 tablet (10 mg total) by mouth daily., Disp: 90 tablet, Rfl: 0 ?  FLUoxetine (PROZAC) 20 MG capsule, TAKE 1 CAPSULE DAILY, Disp: 30 capsule, Rfl: 0 ?  fluticasone (FLONASE) 50 MCG/ACT nasal spray, Place 2 sprays into both nostrils daily., Disp: 16 g, Rfl: 6 ?  ibuprofen (ADVIL,MOTRIN) 800 MG tablet, Take 800 mg by mouth every 8 (eight) hours as needed for fever or moderate pain., Disp: , Rfl:  ?  lisinopril (ZESTRIL) 40 MG tablet, TAKE 1 TABLET DAILY, Disp: 30 tablet, Rfl: 0 ?  Multiple Vitamin (MULTIVITAMIN) capsule, Take 1 capsule by mouth daily., Disp: 90 capsule, Rfl: 1 ?  pravastatin (PRAVACHOL) 40 MG tablet, TAKE 1 TABLET EVERY EVENING, Disp: 30 tablet, Rfl: 0 ?  triamcinolone cream (KENALOG) 0.1 %, Apply 1 application topically 2 (two) times daily., Disp: 80 g, Rfl: 1 ?  aspirin 325 MG tablet, Take 325 mg by mouth daily. (Patient not taking: Reported on 03/22/2021), Disp: , Rfl:   ? ?Allergies  ?Allergen Reactions  ? Penicillins   ? ?Past Medical History:  ?Diagnosis Date  ? Hypertension   ? Mixed hyperlipidemia 04/20/2020  ?  ?Past Surgical History:  ?Procedure Laterality Date  ? foot sx    ? TOTAL ABDOMINAL HYSTERECTOMY    ?  ?Social History  ? ?Socioeconomic History  ? Marital status: Single  ?  Spouse name: Not on file  ?  Number of children: Not on file  ? Years of education: Not on file  ? Highest education level: Not on file  ?Occupational History  ? Not on file  ?Tobacco Use  ? Smoking status: Every Day  ?  Packs/day: 0.25  ?  Types: Cigarettes  ?  Start date: 07/21/1981  ? Smokeless tobacco: Never  ?Vaping Use  ? Vaping Use: Never used  ?Substance and Sexual Activity  ? Alcohol use: Yes  ?  Comment: occ  ? Drug use: Never  ? Sexual activity: Not Currently  ?Other Topics Concern  ? Not on file  ?Social History Narrative  ? Not on file  ? ?Social Determinants of Health  ? ?Financial Resource Strain: Not on file  ?Food Insecurity: Not on file  ?Transportation Needs: Not on file  ?Physical Activity: Not on file  ?Stress: Not on file  ?Social Connections: Not on file  ?Intimate Partner Violence: Not on file  ?  ? ?   ?Objective:  ?  ?BP 103/65   Pulse 79   Temp (!) 97.2 ?F (36.2 ?C) (Temporal)   Ht _0  (1.499 m)   Wt 104 lb 6.4 oz (47.4 kg)   BMI 21.09 kg/m?  ? ?Wt Readings from Last 3 Encounters:  ?03/22/21 104 lb 6.4 oz (47.4 kg)  ?09/21/20 98 lb 6.4 oz (44.6 kg)  ?06/23/20 104 lb 3.2 oz (47.3 kg)  ? ? ?Physical Exam ?Vitals reviewed.  ?Constitutional:   ?   General: She is not in acute distress. ?   Appearance: Normal appearance. She is normal weight. She is not ill-appearing, toxic-appearing or diaphoretic.  ?HENT:  ?   Head: Normocephalic and atraumatic.  ?Eyes:  ?   General: No scleral icterus.    ?   Right eye: No discharge.     ?   Left eye: No discharge.  ?   Conjunctiva/sclera: Conjunctivae normal.  ?Cardiovascular:  ?   Rate and Rhythm: Normal rate and regular rhythm.  ?   Heart sounds: Normal heart sounds. No murmur heard. ?  No friction rub. No gallop.  ?Pulmonary:  ?   Effort: Pulmonary effort is normal. No respiratory distress.  ?   Breath sounds: Normal breath sounds. No stridor. No wheezing, rhonchi or rales.  ?Musculoskeletal:     ?   General: Normal range of motion.  ?   Cervical back: Normal range of motion.   ?Skin: ?   General: Skin is warm and dry.  ?   Capillary Refill: Capillary refill takes less than 2 seconds.  ?Neurological:  ?   General: No focal deficit present.  ?   Mental Status: She is alert and oriented to person, place, and time. Mental status is at baseline.  ?Psychiatric:     ?   Mood and Affect: Mood normal.     ?   Behavior: Behavior normal.     ?   Thought Content:  Thought content normal.     ?   Judgment: Judgment normal.  ? ? ?Lab Results  ?Component Value Date  ? TSH 1.980 06/23/2020  ? ?Lab Results  ?Component Value Date  ? WBC 7.0 09/21/2020  ? HGB 14.6 09/21/2020  ? HCT 45.5 09/21/2020  ? MCV 95 09/21/2020  ? PLT 256 09/21/2020  ? ?Lab Results  ?Component Value Date  ? NA 142 09/21/2020  ? K 4.4 09/21/2020  ? CO2 25 09/21/2020  ? GLUCOSE 97 09/21/2020  ? BUN 14 09/21/2020  ? CREATININE 0.82 09/21/2020  ? BILITOT 0.2 09/21/2020  ? ALKPHOS 107 09/21/2020  ? AST 27 09/21/2020  ? ALT 21 09/21/2020  ? PROT 6.9 09/21/2020  ? ALBUMIN 4.5 09/21/2020  ? CALCIUM 9.3 09/21/2020  ? ANIONGAP 9 10/22/2017  ? EGFR 85 09/21/2020  ? ?Lab Results  ?Component Value Date  ? CHOL 149 09/21/2020  ? ?Lab Results  ?Component Value Date  ? HDL 60 09/21/2020  ? ?Lab Results  ?Component Value Date  ? [place] 70 09/21/2020  ? ?Lab Results  ?Component Value Date  ? TRIG 107 09/21/2020  ? ?Lab Results  ?Component Value Date  ? CHOLHDL 2.5 09/21/2020  ? ?No results found for: HGBA1C ? ?   ? ? ? ? ?

## 2021-05-07 DIAGNOSIS — Z1211 Encounter for screening for malignant neoplasm of colon: Secondary | ICD-10-CM | POA: Diagnosis not present

## 2021-05-18 LAB — COLOGUARD: COLOGUARD: NEGATIVE

## 2021-09-02 ENCOUNTER — Encounter: Payer: Self-pay | Admitting: Family Medicine

## 2021-09-02 ENCOUNTER — Ambulatory Visit (INDEPENDENT_AMBULATORY_CARE_PROVIDER_SITE_OTHER): Payer: Medicaid Other | Admitting: Family Medicine

## 2021-09-02 DIAGNOSIS — M940 Chondrocostal junction syndrome [Tietze]: Secondary | ICD-10-CM

## 2021-09-02 DIAGNOSIS — J40 Bronchitis, not specified as acute or chronic: Secondary | ICD-10-CM | POA: Diagnosis not present

## 2021-09-02 MED ORDER — PREDNISONE 10 MG (21) PO TBPK
ORAL_TABLET | ORAL | 0 refills | Status: DC
Start: 2021-09-02 — End: 2021-09-28

## 2021-09-02 MED ORDER — BENZONATATE 100 MG PO CAPS: 100.0000 mg | ORAL_CAPSULE | Freq: Three times a day (TID) | ORAL | 0 refills | Status: DC | PRN

## 2021-09-02 NOTE — Progress Notes (Signed)
Virtual Visit  Note Due to COVID-19 pandemic this visit was conducted virtually. This visit type was conducted due to national recommendations for restrictions regarding the COVID-19 Pandemic (e.g. social distancing, sheltering in place) in an effort to limit this patient's exposure and mitigate transmission in our community. All issues noted in this document were discussed and addressed.  A physical exam was not performed with this format.  I connected with [facility] on 09/02/21 at 0902 by telephone and verified that I am speaking with the correct person using two identifiers. Laura Golden is currently located at home and no one is currently with her during the visit. The provider, Gabriel Earing, FNP is located in their office at time of visit.  I discussed the limitations, risks, security and privacy concerns of performing an evaluation and management service by telephone and the availability of in person appointments. I also discussed with the patient that there may be a patient responsible charge related to this service. The patient expressed understanding and agreed to proceed.  CC: cough  History and Present Illness:  Cough This is a new problem. The current episode started in the past 7 days. The problem has been gradually worsening. The problem occurs every few minutes. The cough is Non-productive. Associated symptoms include nasal congestion. Pertinent negatives include no chest pain, chills, ear congestion, ear pain, fever, headaches, myalgias, postnasal drip, sore throat, shortness of breath or wheezing. Associated symptoms comments: Rib pain with coughing. Risk factors for lung disease include smoking/tobacco exposure. She has tried nothing for the symptoms. The treatment provided no relief. There is no history of asthma, bronchitis, COPD, emphysema or pneumonia.    Review of Systems  Constitutional:  Negative for chills and fever.  HENT:  Negative for ear pain, postnasal drip  and sore throat.   Respiratory:  Positive for cough. Negative for shortness of breath and wheezing.   Cardiovascular:  Negative for chest pain.  Musculoskeletal:  Negative for myalgias.  Neurological:  Negative for headaches.    Observations/Objective: Alert and oriented x 3. Able to speak in full sentences without difficulty. Coughing noted.   Assessment and Plan: Laura Golden was seen today for cough.  Diagnoses and all orders for this visit:  Bronchitis Tessalon perles as below, prednisone taper. Discussed OTC medication for cough, congestion. Discussed to follow up if no improvement over the weekend as she may need abx coverage given tobacco use.  -     benzonatate (TESSALON PERLES) 100 MG capsule; Take 1 capsule (100 mg total) by mouth 3 (three) times daily as needed for cough. -     predniSONE (STERAPRED UNI-PAK 21 TAB) 10 MG (21) TBPK tablet; Use as directed on back of pill pack  Costochondritis Due to acute cough. Prednisone taper as below.  -     predniSONE (STERAPRED UNI-PAK 21 TAB) 10 MG (21) TBPK tablet; Use as directed on back of pill pack   Follow Up Instructions: Return to office for new or worsening symptoms, or if symptoms persist.     I discussed the assessment and treatment plan with the patient. The patient was provided an opportunity to ask questions and all were answered. The patient agreed with the plan and demonstrated an understanding of the instructions.   The patient was advised to call back or seek an in-person evaluation if the symptoms worsen or if the condition fails to improve as anticipated.  The above assessment and management plan was discussed with the patient. The patient verbalized  understanding of and has agreed to the management plan. Patient is aware to call the clinic if symptoms persist or worsen. Patient is aware when to return to the clinic for a follow-up visit. Patient educated on when it is appropriate to go to the emergency department.    Time call ended:  0913  I provided 11 minutes of  non face-to-face time during this encounter.    Gabriel Earing, FNP

## 2021-09-05 ENCOUNTER — Ambulatory Visit: Payer: Medicaid Other | Admitting: Nurse Practitioner

## 2021-09-05 ENCOUNTER — Encounter: Payer: Self-pay | Admitting: Nurse Practitioner

## 2021-09-05 VITALS — BP 126/83 | HR 82 | Temp 98.0°F | Resp 20 | Ht 59.0 in | Wt 101.0 lb

## 2021-09-05 DIAGNOSIS — J40 Bronchitis, not specified as acute or chronic: Secondary | ICD-10-CM | POA: Diagnosis not present

## 2021-09-05 MED ORDER — ALBUTEROL SULFATE HFA 108 (90 BASE) MCG/ACT IN AERS: 2.0000 | INHALATION_SPRAY | Freq: Four times a day (QID) | RESPIRATORY_TRACT | 0 refills | Status: AC | PRN

## 2021-09-05 MED ORDER — AZITHROMYCIN 250 MG PO TABS: ORAL_TABLET | ORAL | 0 refills | Status: DC

## 2021-09-05 NOTE — Patient Instructions (Signed)

## 2021-09-05 NOTE — Progress Notes (Signed)
   Subjective:    Patient ID: Laura Golden, female    DOB: 1965/10/28, 56 y.o.   MRN: [id number]   Chief Complaint: Cough   Cough This is a new problem. The current episode started in the past 7 days. The problem has been waxing and waning. The problem occurs every few minutes. The cough is Productive of sputum. Associated symptoms include rhinorrhea. Pertinent negatives include no chills, ear congestion, fever or sore throat. Nothing aggravates the symptoms. Treatments tried: she is on tessalon perles and prednisone. The treatment provided mild relief.       Review of Systems  Constitutional:  Negative for chills and fever.  HENT:  Positive for rhinorrhea. Negative for sore throat.   Respiratory:  Positive for cough.        Objective:   Physical Exam Vitals reviewed.  Constitutional:      Appearance: Normal appearance.  Cardiovascular:     Rate and Rhythm: Normal rate and regular rhythm.     Heart sounds: Normal heart sounds.  Skin:    General: Skin is warm.  Neurological:     General: No focal deficit present.     Mental Status: She is alert.  Psychiatric:        Mood and Affect: Mood normal.        Behavior: Behavior normal.   BP 126/83   Pulse 82   Temp 98 F (36.7 C) (Oral)   Resp 20   Ht 4\' 11"  (1.499 m)   Wt 101 lb (45.8 kg)   SpO2 99%   BMI 20.40 kg/m          Assessment & Plan:   [facility] in today with chief complaint of Cough   1. Bronchitis 1. Take meds as prescribed 2. Use a cool mist humidifier especially during the winter months and when heat has been humid. 3. Use saline nose sprays frequently 4. Saline irrigations of the nose can be very helpful if done frequently.  * 4X daily for 1 week*  * Use of a nettie pot can be helpful with this. Follow directions with this* 5. Drink plenty of fluids 6. Keep thermostat turn down low 7.For any cough or congestion- tessalon perles 8. For fever or aces or pains- take tylenol or ibuprofen  appropriate for age and weight.  * for fevers greater than 101 orally you may alternate ibuprofen and tylenol every  3 hours.   Continue prednisone and tessalon perles as prescribed  - albuterol (VENTOLIN HFA) 108 (90 Base) MCG/ACT inhaler; Inhale 2 puffs into the lungs every 6 (six) hours as needed for wheezing or shortness of breath.  Dispense: 8 g; Refill: 0 - azithromycin (ZITHROMAX Z-PAK) 250 MG tablet; As directed  Dispense: 6 tablet; Refill: 0    The above assessment and management plan was discussed with the patient. The patient verbalized understanding of and has agreed to the management plan. Patient is aware to call the clinic if symptoms persist or worsen. Patient is aware when to return to the clinic for a follow-up visit. Patient educated on when it is appropriate to go to the emergency department.   Mary-Margaret 03-27-1985, FNP

## 2021-09-22 ENCOUNTER — Ambulatory Visit: Payer: Medicaid Other | Admitting: Family Medicine

## 2021-09-28 ENCOUNTER — Encounter: Payer: Self-pay | Admitting: Nurse Practitioner

## 2021-09-28 ENCOUNTER — Ambulatory Visit (INDEPENDENT_AMBULATORY_CARE_PROVIDER_SITE_OTHER): Payer: Medicaid Other | Admitting: Nurse Practitioner

## 2021-09-28 VITALS — BP 123/73 | HR 73 | Temp 98.6°F | Ht 59.0 in | Wt 101.4 lb

## 2021-09-28 DIAGNOSIS — J40 Bronchitis, not specified as acute or chronic: Secondary | ICD-10-CM | POA: Diagnosis not present

## 2021-09-28 DIAGNOSIS — J069 Acute upper respiratory infection, unspecified: Secondary | ICD-10-CM

## 2021-09-28 DIAGNOSIS — M940 Chondrocostal junction syndrome [Tietze]: Secondary | ICD-10-CM | POA: Diagnosis not present

## 2021-09-28 MED ORDER — PREDNISONE 10 MG (21) PO TBPK: ORAL_TABLET | ORAL | 0 refills | Status: DC

## 2021-09-28 MED ORDER — DM-GUAIFENESIN ER 30-600 MG PO TB12: 1.0000 | ORAL_TABLET | Freq: Two times a day (BID) | ORAL | 0 refills | Status: DC

## 2021-09-28 NOTE — Patient Instructions (Signed)

## 2021-09-28 NOTE — Progress Notes (Signed)
Acute Office Visit  Subjective:     Patient ID: Geovanna Simko, female    DOB: 06/27/1965, 56 y.o.   MRN: [id number]  Chief Complaint  Patient presents with   Establish Care    Switching pcps    URI  This is a new problem. The current episode started in the past 7 days. The problem has been unchanged. There has been no fever. Associated symptoms include congestion and coughing. Pertinent negatives include no abdominal pain, headaches or rash. She has tried decongestant for the symptoms. The treatment provided mild relief.    Review of Systems  Constitutional: Negative.   HENT:  Positive for congestion.   Eyes: Negative.   Respiratory:  Positive for cough.   Cardiovascular: Negative.   Gastrointestinal:  Negative for abdominal pain.  Genitourinary: Negative.   Musculoskeletal: Negative.   Skin: Negative.  Negative for itching and rash.  Neurological:  Negative for headaches.  All other systems reviewed and are negative.       Objective:    BP 123/73   Pulse 73   Temp 98.6 F (37 C)   Ht 4\' 11"  (1.499 m)   Wt 101 lb 6.4 oz (46 kg)   SpO2 96%   BMI 20.48 kg/m  BP Readings from Last 3 Encounters:  09/28/21 123/73  09/05/21 126/83  03/22/21 103/65   Wt Readings from Last 3 Encounters:  09/28/21 101 lb 6.4 oz (46 kg)  09/05/21 101 lb (45.8 kg)  03/22/21 104 lb 6.4 oz (47.4 kg)      Physical Exam Vitals and nursing note reviewed.  Constitutional:      Appearance: Normal appearance.  HENT:     Head: Normocephalic.     Right Ear: External ear normal.     Left Ear: External ear normal.     Nose: Congestion present.     Mouth/Throat:     Mouth: Mucous membranes are moist.  Eyes:     Conjunctiva/sclera: Conjunctivae normal.  Cardiovascular:     Rate and Rhythm: Normal rate and regular rhythm.     Pulses: Normal pulses.     Heart sounds: Normal heart sounds.  Pulmonary:     Effort: Pulmonary effort is normal.     Breath sounds: Normal breath sounds.   Abdominal:     General: Bowel sounds are normal.  Musculoskeletal:        General: Normal range of motion.  Skin:    General: Skin is warm.     Findings: No rash.  Neurological:     General: No focal deficit present.     Mental Status: She is alert and oriented to person, place, and time.  Psychiatric:        Behavior: Behavior normal.     No results found for any visits on 09/28/21.      Assessment & Plan:  Symptoms not well controlled and not resolving. Education provided to patient on smoking cessation Take meds as prescribed - Use a cool mist humidifier  -Continue breathing treatment as prescribed -Use saline nose sprays frequently -Force fluids -For fever or aches or pains- take Tylenol or ibuprofen. -If symptoms do not improve, she may need to be COVID tested to rule this out Follow up with worsening unresolved symptoms  Problem List Items Addressed This Visit   None Visit Diagnoses     URI, acute    -  Primary   Relevant Medications   dextromethorphan-guaiFENesin (MUCINEX DM) 30-600 MG 12hr tablet  Bronchitis       Relevant Medications   predniSONE (STERAPRED UNI-PAK 21 TAB) 10 MG (21) TBPK tablet   Costochondritis           Meds ordered this encounter  Medications   predniSONE (STERAPRED UNI-PAK 21 TAB) 10 MG (21) TBPK tablet    Sig: 6 tablet day 1, 5 tablet day 2, 4 tablet day 3, 3 tablet day 4, 2 tablet day 5, 1 tablet day 6    Dispense:  1 each    Refill:  0    Order Specific Question:   Supervising Provider    Answer:   Standley Brooking   dextromethorphan-guaiFENesin (MUCINEX DM) 30-600 MG 12hr tablet    Sig: Take 1 tablet by mouth 2 (two) times daily.    Dispense:  30 tablet    Refill:  0    Order Specific Question:   Supervising Provider    Answer:   Mechele Claude [phone]    Return if symptoms worsen or fail to improve.  Daryll Drown, NP

## 2021-11-10 ENCOUNTER — Telehealth: Payer: Self-pay

## 2021-11-10 NOTE — Progress Notes (Signed)
..   Medicaid Managed Care   Unsuccessful Outreach Note  11/10/2021 Name: Laura Golden MRN: [id number] DOB: Dec 16, 1965  Referred by: Ivy Lynn, NP Reason for referral : Appointment   An unsuccessful telephone outreach was attempted today. The patient was referred to the case management team for assistance with care management and care coordination.   Follow Up Plan: The care management team will reach out to the patient again over the next 14 days.     [place]

## 2022-03-09 ENCOUNTER — Encounter: Payer: Self-pay | Admitting: Family Medicine

## 2022-03-23 ENCOUNTER — Ambulatory Visit (INDEPENDENT_AMBULATORY_CARE_PROVIDER_SITE_OTHER): Payer: Medicaid Other | Admitting: Podiatry

## 2022-03-23 ENCOUNTER — Encounter: Payer: Self-pay | Admitting: Podiatry

## 2022-03-23 DIAGNOSIS — L84 Corns and callosities: Secondary | ICD-10-CM

## 2022-03-23 DIAGNOSIS — M216X1 Other acquired deformities of right foot: Secondary | ICD-10-CM

## 2022-03-23 DIAGNOSIS — B351 Tinea unguium: Secondary | ICD-10-CM | POA: Diagnosis not present

## 2022-03-23 NOTE — Progress Notes (Signed)
  Subjective:  Patient ID: Laura Golden, female    DOB: 07-14-1965,   MRN: [id number]  Chief Complaint  Patient presents with   Nail Problem    Routine foot care / callus trim     57 y.o. female presents for routine nail care and callus debridement. Relates she has not had a trimming in over a years and is well past due. Relates the areas have been very painful. Denies any other pedal complaints. Denies n/v/f/c.   Past Medical History:  Diagnosis Date   Hypertension    Mixed hyperlipidemia 04/20/2020    Objective:  Physical Exam: Vascular: DP/PT pulses 2/4 bilateral. CFT <3 seconds. Normal hair growth on digits. No edema.  Skin. No lacerations or abrasions bilateral feet. Scaling lesion noted bilateral plantar feet. Hyperkeratosis noted bilateral 5th metatarsal heads and medial hallux bilateral. Nails 1-5 are thickened discolored and elongated with subungual debris.  Musculoskeletal: MMT 5/5 bilateral lower extremities in DF, PF, Inversion and Eversion. Deceased ROM in DF of ankle joint.  Neurological: Sensation intact to light touch.   Assessment:   1. Dermatophytosis of nail   2. Prominent metatarsal head, right   3. Callus of foot       Plan:  Patient was evaluated and treated and all questions answered. ABN signed today.  -Mechanically debrided all nails 1-5 bilateral using sterile nail nipper and filed with dremel without incident  -Hyperkeratotic lesions x 3 were debrided without incident with chisel.  -Applied salycylic acid treatment to area with dressing. Advised to remove bandaging tomorrow.  -Answered all patient questions -Patient to return as needed for routine foot care.  -Patient advised to call the office if any problems or questions arise in the meantime.   Lorenda Peck, DPM

## 2022-03-24 ENCOUNTER — Telehealth: Payer: Self-pay | Admitting: Podiatry

## 2022-03-24 NOTE — Telephone Encounter (Signed)
Pt was asking about a medication called salinocaine cream to be filled.  Please advise

## 2022-03-27 ENCOUNTER — Other Ambulatory Visit: Payer: Self-pay | Admitting: Podiatry

## 2022-03-27 NOTE — Telephone Encounter (Signed)
This is not something that can be prescribed. She can use OTC Compound W or salicyclic acid as we had discussed.

## 2022-03-30 ENCOUNTER — Encounter: Payer: Self-pay | Admitting: Family Medicine

## 2022-03-30 ENCOUNTER — Ambulatory Visit: Payer: Medicaid Other | Admitting: Family Medicine

## 2022-03-30 VITALS — BP 138/82 | HR 79 | Temp 98.5°F | Ht 59.0 in | Wt 97.2 lb

## 2022-03-30 DIAGNOSIS — F339 Major depressive disorder, recurrent, unspecified: Secondary | ICD-10-CM | POA: Insufficient documentation

## 2022-03-30 DIAGNOSIS — E782 Mixed hyperlipidemia: Secondary | ICD-10-CM

## 2022-03-30 DIAGNOSIS — E559 Vitamin D deficiency, unspecified: Secondary | ICD-10-CM

## 2022-03-30 DIAGNOSIS — I10 Essential (primary) hypertension: Secondary | ICD-10-CM

## 2022-03-30 DIAGNOSIS — R232 Flushing: Secondary | ICD-10-CM | POA: Diagnosis not present

## 2022-03-30 DIAGNOSIS — R7309 Other abnormal glucose: Secondary | ICD-10-CM | POA: Diagnosis not present

## 2022-03-30 DIAGNOSIS — J302 Other seasonal allergic rhinitis: Secondary | ICD-10-CM

## 2022-03-30 MED ORDER — FLUTICASONE PROPIONATE 50 MCG/ACT NA SUSP: 2.0000 | Freq: Every day | NASAL | 6 refills | Status: AC

## 2022-03-30 MED ORDER — FLUOXETINE HCL 20 MG PO CAPS: 20.0000 mg | ORAL_CAPSULE | Freq: Every day | ORAL | 0 refills | Status: DC

## 2022-03-30 MED ORDER — AMLODIPINE BESYLATE 5 MG PO TABS: 5.0000 mg | ORAL_TABLET | Freq: Every day | ORAL | 0 refills | Status: DC

## 2022-03-30 MED ORDER — THERA VITAL M PO TABS: 1.0000 | ORAL_TABLET | Freq: Every day | ORAL | 2 refills | Status: AC

## 2022-03-30 MED ORDER — PRAVASTATIN SODIUM 40 MG PO TABS: 40.0000 mg | ORAL_TABLET | Freq: Every evening | ORAL | 0 refills | Status: DC

## 2022-03-30 MED ORDER — CETIRIZINE HCL 10 MG PO TABS: 10.0000 mg | ORAL_TABLET | Freq: Every day | ORAL | 0 refills | Status: DC

## 2022-03-30 MED ORDER — LISINOPRIL 40 MG PO TABS: 40.0000 mg | ORAL_TABLET | Freq: Every day | ORAL | 0 refills | Status: DC

## 2022-03-30 NOTE — Progress Notes (Signed)
New Patient Office Visit  Subjective   Patient ID: Laura Golden, female    DOB: 25-Mar-1965  Age: 57 y.o. MRN: [id number]  CC:  Chief Complaint  Patient presents with   [place] presents to follow up on her chronic conditions     Has been out of her medications since the beginning of the month.   Hypertension Occasionally checks her BP. Has an "old" machine at home. Hypertension This is a chronic problem. The current episode started more than 1 year ago. The problem is unchanged. The problem is uncontrolled. Associated symptoms include blurred vision and sweats. Pertinent negatives include no anxiety, chest pain, headaches, malaise/fatigue, neck pain, palpitations, peripheral edema, PND or shortness of breath. Agents associated with hypertension include NSAIDs. Risk factors for coronary artery disease include dyslipidemia and smoking/tobacco exposure. Past treatments include ACE inhibitors. The current treatment provides no improvement. Compliance problems include exercise and medication cost.    Hyperlipidemia  States that she was 115lbs and then when she found out she had high cholesterol she started working her on weight. She is now down and concerned she lost too much.   Allergies  Does not use allergy medications that much. Uses humidifier. Currently has cough, with runny nose.   Hot Flashes  Previously using prozac for hot flashes. States that it was making depression worse. Wants something that will stop making her sweat so much. States she has them all night and morning. Is not able to sleep because of them and take ibuprofen to help her sleep.  States on prozac she can still fill sweats but not heat of hot flashes.  Outpatient Encounter Medications as of 03/30/2022  Medication Sig   acetaminophen (TYLENOL) 325 MG tablet Take 325 mg by mouth every 6 (six) hours as needed for mild pain or headache.   albuterol (VENTOLIN HFA) 108 (90 Base) MCG/ACT  inhaler Inhale 2 puffs into the lungs every 6 (six) hours as needed for wheezing or shortness of breath.   amLODipine (NORVASC) 5 MG tablet Take 1 tablet (5 mg total) by mouth daily.   aspirin 325 MG tablet Take 325 mg by mouth daily.   cetirizine (ZYRTEC) 10 MG tablet Take 1 tablet (10 mg total) by mouth daily.   FLUoxetine (PROZAC) 20 MG capsule Take 1 capsule (20 mg total) by mouth daily.   fluticasone (FLONASE) 50 MCG/ACT nasal spray Place 2 sprays into both nostrils daily.   ibuprofen (ADVIL,MOTRIN) 800 MG tablet Take 800 mg by mouth every 8 (eight) hours as needed for fever or moderate pain.   lisinopril (ZESTRIL) 40 MG tablet Take 1 tablet (40 mg total) by mouth daily.   Multiple Vitamin (MULTIVITAMIN) capsule Take 1 capsule by mouth daily.   pravastatin (PRAVACHOL) 40 MG tablet Take 1 tablet (40 mg total) by mouth every evening.   triamcinolone cream (KENALOG) 0.1 % Apply 1 application. topically 2 (two) times daily.   [DISCONTINUED] azithromycin (ZITHROMAX Z-PAK) 250 MG tablet As directed   [DISCONTINUED] benzonatate (TESSALON PERLES) 100 MG capsule Take 1 capsule (100 mg total) by mouth 3 (three) times daily as needed for cough.   [DISCONTINUED] dextromethorphan-guaiFENesin (MUCINEX DM) 30-600 MG 12hr tablet Take 1 tablet by mouth 2 (two) times daily.   [DISCONTINUED] predniSONE (STERAPRED UNI-PAK 21 TAB) 10 MG (21) TBPK tablet 6 tablet day 1, 5 tablet day 2, 4 tablet day 3, 3 tablet day 4, 2 tablet day 5, 1 tablet day 6  No facility-administered encounter medications on file as of 03/30/2022.    Past Medical History:  Diagnosis Date   Hypertension    Mixed hyperlipidemia 04/20/2020    Past Surgical History:  Procedure Laterality Date   foot sx     TOTAL ABDOMINAL HYSTERECTOMY      Family History  Problem Relation Age of Onset   Diabetes Mother    Heart disease Mother    Hypertension Mother    Liver disease Father    Hypertension Sister    Asthma Daughter    Allergies  Daughter    Hyperlipidemia Brother    Breast cancer Other     Social History   Socioeconomic History   Marital status: Single    Spouse name: Not on file   Number of children: Not on file   Years of education: Not on file   Highest education level: Not on file  Occupational History   Not on file  Tobacco Use   Smoking status: Every Day    Packs/day: .25    Types: Cigarettes    Start date: 07/21/1981   Smokeless tobacco: Never  Vaping Use   Vaping Use: Never used  Substance and Sexual Activity   Alcohol use: Yes    Comment: occ   Drug use: Never   Sexual activity: Not Currently  Other Topics Concern   Not on file  Social History Narrative   Not on file   Social Determinants of Health   Financial Resource Strain: Not on file  Food Insecurity: Not on file  Transportation Needs: Not on file  Physical Activity: Not on file  Stress: Not on file  Social Connections: Not on file  Intimate Partner Violence: Not on file    Review of Systems  Constitutional:  Negative for malaise/fatigue.  Eyes:  Positive for blurred vision.  Respiratory:  Negative for shortness of breath.   Cardiovascular:  Negative for chest pain, palpitations and PND.  Musculoskeletal:  Negative for neck pain.  Neurological:  Negative for headaches.  All other systems reviewed and are negative.  As per HPI   Objective   BP 138/82   Pulse 79   Temp 98.5 F (36.9 C)   Ht 4\' 11"  (1.499 m)   Wt 97 lb 3.2 oz (44.1 kg)   SpO2 98%   BMI 19.63 kg/m     03/30/2022    8:55 AM 09/28/2021    9:21 AM 09/05/2021    9:15 AM  Depression screen PHQ 2/9  Decreased Interest 3 0 0  Down, Depressed, Hopeless 0 0 0  PHQ - 2 Score 3 0 0  Altered sleeping 0 0 0  Tired, decreased energy 0 0 0  Change in appetite 0 0 0  Feeling bad or failure about yourself  0 0 0  Trouble concentrating 0 0 0  Moving slowly or fidgety/restless 0 0 0  Suicidal thoughts 0 0 0  PHQ-9 Score 3 0 0  Difficult doing work/chores  Somewhat difficult         03/30/2022    8:55 AM 09/05/2021    9:15 AM 03/22/2021   10:42 AM 09/21/2020    1:09 PM  GAD 7 : Generalized Anxiety Score  Nervous, Anxious, on Edge 0 0 0 0  Control/stop worrying 0 0 0 0  Worry too much - different things 0 0 0 0  Trouble relaxing 0 0 0 0  Restless 0 0 0 0  Easily annoyed or irritable 0 0  0 0  Afraid - awful might happen 0 0 0 0  Total GAD 7 Score 0 0 0 0  Anxiety Difficulty   Not difficult at all Not difficult at all     [facility]   03/30/22 0841  Weight: 97 lb 3.2 oz (44.1 kg)       03/30/2022    8:52 AM 03/30/2022    8:41 AM 09/28/2021    8:58 AM  Vitals with BMI  Height  4\' 11"  4\' 11"   Weight  97 lbs 3 oz 101 lbs 6 oz  BMI  [id number] 0000000  Systolic 0000000 [id number]  Diastolic 82 85 73  Pulse  79 73     Physical Exam Constitutional:      General: She is not in acute distress.    Appearance: Normal appearance. She is not ill-appearing, toxic-appearing or diaphoretic.  Cardiovascular:     Rate and Rhythm: Normal rate.     Pulses: Normal pulses.     Heart sounds: Normal heart sounds. No murmur heard.    No gallop.  Pulmonary:     Effort: Pulmonary effort is normal. No respiratory distress.     Breath sounds: Normal breath sounds. No stridor. No wheezing, rhonchi or rales.  Skin:    General: Skin is warm.     Capillary Refill: Capillary refill takes less than 2 seconds.  Neurological:     General: No focal deficit present.     Mental Status: She is alert and oriented to person, place, and time. Mental status is at baseline.     Motor: No weakness.  Psychiatric:        Mood and Affect: Mood normal.        Behavior: Behavior normal.        Thought Content: Thought content normal.        Judgment: Judgment normal.    Assessment & Plan:  1. Primary hypertension Medications refilled as below. Elevated BP today in office. Discussed with patient monitoring BP at home. Provided BP log to patient in clinic today. Instructed pt  to take BP first thing in the morning after sitting for 5 minutes with feet flat on the floor, arm at heart level. Discussed with patient options for BP cuff at [place], [employer], [place]. Pt verbalized they were able to obtain BP cuff. Will review measurements with patient at follow up and determine plan for BP management.  Labs as below. Will communicate results to patient once available. Pt has been out of medications since the start of the month. Will follow up in 3 months.  - amLODipine (NORVASC) 5 MG tablet; Take 1 tablet (5 mg total) by mouth daily.  Dispense: 90 tablet; Refill: 0 - lisinopril (ZESTRIL) 40 MG tablet; Take 1 tablet (40 mg total) by mouth daily.  Dispense: 90 tablet; Refill: 0 - CBC with Differential/Platelet - CMP14+EGFR  2. Mixed hyperlipidemia Labs as below. Will communicate results to patient once available.  Fasting Medication refilled as below - pravastatin (PRAVACHOL) 40 MG tablet; Take 1 tablet (40 mg total) by mouth every evening.  Dispense: 90 tablet; Refill: 0 - Lipid panel  3. Seasonal allergies Medication refilled as below.  - cetirizine (ZYRTEC) 10 MG tablet; Take 1 tablet (10 mg total) by mouth daily.  Dispense: 90 tablet; Refill: 0 - fluticasone (FLONASE) 50 MCG/ACT nasal spray; Place 2 sprays into both nostrils daily.  Dispense: 16 g; Refill: 6 - Multiple Vitamins-Minerals (MULTIVITAMIN) tablet; Take 1 tablet by  mouth daily.  Dispense: 30 tablet; Refill: 2  4. Hot flashes Labs as below. Will communicate results to patient once available.  Refill placed as below. Explained to patient that this medication will still cause sweating to occur at night. Due to insurance coverage, have limited ability for other medications. Pt not covered for ve[place]ah.  - FLUoxetine (PROZAC) 20 MG capsule; Take 1 capsule (20 mg total) by mouth daily.  Dispense: 90 capsule; Refill: 0 - VITAMIN D 25 Hydroxy (Vit-D Deficiency, Fractures) - TSH  The above assessment and  management plan was discussed with the patient. The patient verbalized understanding of and has agreed to the management plan using shared-decision making. Patient is aware to call the clinic if they develop any new symptoms or if symptoms fail to improve or worsen. Patient is aware when to return to the clinic for a follow-up visit. Patient educated on when it is appropriate to go to the emergency department.   3 months   Donzetta Kohut, DNP-FNP [facility] [address] [place], [place] [address] [phone]

## 2022-03-31 LAB — CBC WITH DIFFERENTIAL/PLATELET
Basophils Absolute: 0.1 10*3/uL (ref 0.0–0.2)
Basos: 1 %
EOS (ABSOLUTE): 0.2 10*3/uL (ref 0.0–0.4)
Eos: 4 %
Hematocrit: 47.2 % — ABNORMAL HIGH (ref 34.0–46.6)
Hemoglobin: 15.6 g/dL (ref 11.1–15.9)
Immature Grans (Abs): 0 10*3/uL (ref 0.0–0.1)
Immature Granulocytes: 0 %
Lymphocytes Absolute: 2.7 10*3/uL (ref 0.7–3.1)
Lymphs: 51 %
MCH: 31.3 pg (ref 26.6–33.0)
MCHC: 33.1 g/dL (ref 31.5–35.7)
MCV: 95 fL (ref 79–97)
Monocytes Absolute: 0.4 10*3/uL (ref 0.1–0.9)
Monocytes: 7 %
Neutrophils Absolute: 1.9 10*3/uL (ref 1.4–7.0)
Neutrophils: 37 %
Platelets: 249 10*3/uL (ref 150–450)
RBC: 4.98 x10E6/uL (ref 3.77–5.28)
RDW: 12.8 % (ref 11.7–15.4)
WBC: 5.2 10*3/uL (ref 3.4–10.8)

## 2022-03-31 LAB — CMP14+EGFR
ALT: 17 IU/L (ref 0–32)
AST: 22 IU/L (ref 0–40)
Albumin/Globulin Ratio: 1.8 (ref 1.2–2.2)
Albumin: 4.4 g/dL (ref 3.8–4.9)
Alkaline Phosphatase: 93 IU/L (ref 44–121)
BUN/Creatinine Ratio: 15 (ref 9–23)
BUN: 13 mg/dL (ref 6–24)
Bilirubin Total: <0.2 mg/dL (ref 0.0–1.2)
CO2: 17 mmol/L — ABNORMAL LOW (ref 20–29)
Calcium: 9.5 mg/dL (ref 8.7–10.2)
Chloride: 108 mmol/L — ABNORMAL HIGH (ref 96–106)
Creatinine, Ser: 0.84 mg/dL (ref 0.57–1.00)
Globulin, Total: 2.4 g/dL (ref 1.5–4.5)
Glucose: 104 mg/dL — ABNORMAL HIGH (ref 70–99)
Potassium: 4.1 mmol/L (ref 3.5–5.2)
Sodium: 144 mmol/L (ref 134–144)
Total Protein: 6.8 g/dL (ref 6.0–8.5)
eGFR: 82 mL/min/{1.73_m2} (ref >59)

## 2022-03-31 LAB — LIPID PANEL
Chol/HDL Ratio: 3.3 ratio (ref 0.0–4.4)
Cholesterol, Total: 196 mg/dL (ref 100–199)
HDL: 59 mg/dL (ref >39)
LDL Chol Calc (NIH): 110 mg/dL — ABNORMAL HIGH (ref 0–99)
Triglycerides: 157 mg/dL — ABNORMAL HIGH (ref 0–149)
VLDL Cholesterol Cal: 27 mg/dL (ref 5–40)

## 2022-03-31 LAB — VITAMIN D 25 HYDROXY (VIT D DEFICIENCY, FRACTURES): Vit D, 25-Hydroxy: 14.7 ng/mL — ABNORMAL LOW (ref 30.0–100.0)

## 2022-03-31 LAB — TSH: TSH: 3.94 u[IU]/mL (ref 0.450–4.500)

## 2022-04-04 MED ORDER — VITAMIN D3 25 MCG (1000 UT) PO CAPS: 1000.0000 [IU] | ORAL_CAPSULE | Freq: Every day | ORAL | 0 refills | Status: AC

## 2022-04-04 NOTE — Addendum Note (Signed)
Addended by: Donzetta Kohut on: 04/04/2022 11:07 AM   Modules accepted: Orders

## 2022-04-05 LAB — HGB A1C W/O EAG: Hgb A1c MFr Bld: 5.8 % — ABNORMAL HIGH (ref 4.8–5.6)

## 2022-04-05 LAB — SPECIMEN STATUS REPORT

## 2022-05-02 ENCOUNTER — Encounter: Payer: Self-pay | Admitting: *Deleted

## 2022-06-23 ENCOUNTER — Ambulatory Visit: Payer: Medicaid Other | Admitting: Podiatry

## 2022-06-23 ENCOUNTER — Encounter: Payer: Self-pay | Admitting: Podiatry

## 2022-06-23 DIAGNOSIS — B351 Tinea unguium: Secondary | ICD-10-CM

## 2022-06-23 DIAGNOSIS — M216X1 Other acquired deformities of right foot: Secondary | ICD-10-CM

## 2022-06-23 DIAGNOSIS — L84 Corns and callosities: Secondary | ICD-10-CM

## 2022-06-23 NOTE — Progress Notes (Signed)
  Subjective:  Patient ID: Laura Golden, female    DOB: 11/30/1965,   MRN: [id number]  No chief complaint on file.   57 y.o. female presents for routine nail care and callus debridement. Relates she has not had a trimming in over a years and is well past due. Relates the areas have been very painful. Denies any other pedal complaints. Denies n/v/f/c.   Past Medical History:  Diagnosis Date   Hypertension    Mixed hyperlipidemia 04/20/2020    Objective:  Physical Exam: Vascular: DP/PT pulses 2/4 bilateral. CFT <3 seconds. Normal hair growth on digits. No edema.  Skin. No lacerations or abrasions bilateral feet. Scaling lesion noted bilateral plantar feet. Hyperkeratosis noted bilateral 5th metatarsal heads and medial hallux bilateral. Nails 1-5 are thickened discolored and elongated with subungual debris.  Musculoskeletal: MMT 5/5 bilateral lower extremities in DF, PF, Inversion and Eversion. Deceased ROM in DF of ankle joint.  Neurological: Sensation intact to light touch.   Assessment:   1. Dermatophytosis of nail   2. Prominent metatarsal head, right   3. Callus of foot        Plan:  Patient was evaluated and treated and all questions answered. ABN signed today.  -Mechanically debrided all nails 1-5 bilateral using sterile nail nipper and filed with dremel without incident  -Hyperkeratotic lesions x 3 were debrided without incident with chisel.  -Applied salycylic acid treatment to area with dressing. Advised to remove bandaging tomorrow.  -Answered all patient questions -Patient to return as needed for routine foot care.  -Patient advised to call the office if any problems or questions arise in the meantime.   Louann Sjogren, DPM

## 2022-06-27 NOTE — Patient Instructions (Signed)
Our records indicate that you are due for your screening mammogram.  Please call the imaging center that does your yearly mammograms to make an appointment for a mammogram at your earliest convenience. Our office also has a mobile unit through the [facility] that comes to our location. Please call our office if you would like to make an appointment.   

## 2022-06-30 ENCOUNTER — Ambulatory Visit: Payer: Medicaid Other | Admitting: Family Medicine

## 2022-07-06 ENCOUNTER — Encounter: Payer: Self-pay | Admitting: Family Medicine

## 2022-07-06 ENCOUNTER — Ambulatory Visit: Payer: Medicaid Other | Admitting: Family Medicine

## 2022-07-06 VITALS — BP 100/66 | HR 58 | Temp 97.8°F | Ht 59.0 in | Wt 93.0 lb

## 2022-07-06 DIAGNOSIS — R7303 Prediabetes: Secondary | ICD-10-CM | POA: Diagnosis not present

## 2022-07-06 DIAGNOSIS — R232 Flushing: Secondary | ICD-10-CM | POA: Diagnosis not present

## 2022-07-06 DIAGNOSIS — F17200 Nicotine dependence, unspecified, uncomplicated: Secondary | ICD-10-CM

## 2022-07-06 DIAGNOSIS — Z8639 Personal history of other endocrine, nutritional and metabolic disease: Secondary | ICD-10-CM

## 2022-07-06 DIAGNOSIS — E782 Mixed hyperlipidemia: Secondary | ICD-10-CM

## 2022-07-06 DIAGNOSIS — I10 Essential (primary) hypertension: Secondary | ICD-10-CM

## 2022-07-06 DIAGNOSIS — Z23 Encounter for immunization: Secondary | ICD-10-CM | POA: Diagnosis not present

## 2022-07-06 DIAGNOSIS — E559 Vitamin D deficiency, unspecified: Secondary | ICD-10-CM

## 2022-07-06 DIAGNOSIS — J302 Other seasonal allergic rhinitis: Secondary | ICD-10-CM | POA: Diagnosis not present

## 2022-07-06 LAB — BAYER DCA HB A1C WAIVED: HB A1C (BAYER DCA - WAIVED): 5.5 % (ref 4.8–5.6)

## 2022-07-06 NOTE — Progress Notes (Signed)
Acute Office Visit  Subjective:  Patient ID: Laura Golden, female    DOB: 05-Sep-1965, 57 y.o.   MRN: [id number]  Chief Complaint  Patient presents with   Medical Management of Chronic Issues   HPI Patient is in today for chronic condition follow up  1. Primary hypertension Blood pressure monitor - she has a new monitor that has a manual pump that she has not figured out how to use. She is trying to avoid salt.  BP at home average ROS Denies anxiety, fatigue, peripheral edema, changes to vision, chest pain, headaches, palpitations, SOB, PND, orthopnea, neck pain,  Meds norvasc  Continues to have hot flashes   2. Mixed hyperlipidemia negative. There is a family history of hyperlipidemia. There is not a family history of early ischemia heart disease. Taking pravastatin daily without.   3. Prediabetes States that she is trying to eat good. States that she is trying to limit carbs and breads. She relies on meals on wheels. States that sometimes she "feels funny" and can eat and feel better. States that it has only happened once since her prediabetes diagnosed. She has switched to diet soda.   4. Seasonal Allergies  States that she is taking medications daily.   5. Hot flashes  States that they are "worrisome"  States that she still sweats with prozac. Hot flashes are interrupting her sleep.   6. Tobacco Use  States that she is trying to smoke less due to the cost.  1 pack every 2 weeks. Has not completed screening for lung cancer. States that she coughs a lot. Started smoking at 57 years old.  ROS As per HPI  Objective:  BP 100/66   Pulse (!) 58   Temp 97.8 F (36.6 C)   Ht 4\' 11"  (1.499 m)   Wt 93 lb (42.2 kg)   SpO2 96%   BMI 18.78 kg/m   Physical Exam Constitutional:      General: She is awake. She is not in acute distress.    Appearance: Normal appearance. She is well-developed and well-groomed. She is not ill-appearing, toxic-appearing or diaphoretic.   Cardiovascular:     Rate and Rhythm: Normal rate and regular rhythm.     Pulses: Normal pulses.          Radial pulses are 2+ on the right side and 2+ on the left side.       Posterior tibial pulses are 2+ on the right side and 2+ on the left side.     Heart sounds: Normal heart sounds. No murmur heard.    No gallop.  Pulmonary:     Effort: Pulmonary effort is normal. No respiratory distress.     Breath sounds: Normal breath sounds. No stridor. No wheezing, rhonchi or rales.  Abdominal:     General: Abdomen is flat.     Palpations: Abdomen is soft.  Musculoskeletal:     Cervical back: Full passive range of motion without pain and neck supple.     Right lower leg: No edema.     Left lower leg: No edema.  Skin:    General: Skin is warm.     Capillary Refill: Capillary refill takes less than 2 seconds.  Neurological:     General: No focal deficit present.     Mental Status: She is alert, oriented to person, place, and time and easily aroused. Mental status is at baseline.     GCS: GCS eye subscore is 4. GCS verbal subscore is  5. GCS motor subscore is 6.     Motor: No weakness.  Psychiatric:        Attention and Perception: Attention and perception normal.        Mood and Affect: Mood and affect normal.        Speech: Speech normal.        Behavior: Behavior normal. Behavior is cooperative.        Thought Content: Thought content normal. Thought content does not include homicidal or suicidal ideation. Thought content does not include homicidal or suicidal plan.        Cognition and Memory: Cognition and memory normal.        Judgment: Judgment normal.       07/06/2022    8:07 AM 03/30/2022    8:55 AM 09/28/2021    9:21 AM  Depression screen PHQ 2/9  Decreased Interest 3 3 0  Down, Depressed, Hopeless 0 0 0  PHQ - 2 Score 3 3 0  Altered sleeping 0 0 0  Tired, decreased energy 0 0 0  Change in appetite 0 0 0  Feeling bad or failure about yourself  0 0 0  Trouble concentrating  0 0 0  Moving slowly or fidgety/restless 0 0 0  Suicidal thoughts 0 0 0  PHQ-9 Score 3 3 0  Difficult doing work/chores Not difficult at all Somewhat difficult       07/06/2022    8:08 AM 03/30/2022    8:55 AM 09/05/2021    9:15 AM 03/22/2021   10:42 AM  GAD 7 : Generalized Anxiety Score  Nervous, Anxious, on Edge 0 0 0 0  Control/stop worrying 0 0 0 0  Worry too much - different things 0 0 0 0  Trouble relaxing 0 0 0 0  Restless 0 0 0 0  Easily annoyed or irritable 0 0 0 0  Afraid - awful might happen 0 0 0 0  Total GAD 7 Score 0 0 0 0  Anxiety Difficulty Not difficult at all   Not difficult at all   Assessment & Plan:  1. Primary hypertension Well controlled on current regimen. Continue current plan. Labs as below. Will communicate results to patient once available.  - CBC with Differential/Platelet  2. Mixed hyperlipidemia Tolerating medication. Continue current plan. Labs as below. Will communicate results to patient once available.  Fasting  - Lipid panel  3. Prediabetes Labs as below. Will communicate results to patient once available.  Reviewed A1C in office today. Well within goal at 5.5%. Praised patient for diet and lifestyle changes. Encouraged her to continue to make changes.  - CMP14+EGFR - Bayer DCA Hb A1c Waived  4. Tobacco use disorder Patient continues to smoke. Praised for significantly reducing intake. Encouraged patient to stop completely. Patient declined cessation at this time. Screening ordered as below.  - Ambulatory Referral Lung Cancer Screening [facility]  5. Hot flashes Not well controlled on current regimen. Limited options for treatment given insurance coverage.   6. Seasonal allergies Well controlled on current regimen. Continue current plan.   7. Personal history of other endocrine, nutritional and metabolic disease Labs as below. Will communicate results to patient once available.  - VITAMIN D 25 Hydroxy (Vit-D Deficiency,  Fractures) - Vitamin B12  8. Need for zoster vaccination - Zoster Recombinant (Shingrix )  The above assessment and management plan was discussed with the patient. The patient verbalized understanding of and has agreed to the management plan using shared-decision making.  Patient is aware to call the clinic if they develop any new symptoms or if symptoms fail to improve or worsen. Patient is aware when to return to the clinic for a follow-up visit. Patient educated on when it is appropriate to go to the emergency department.   Return in about 6 months (around 01/05/2023) for Chronic Condition Follow up.  Neale Burly, DNP-FNP [facility] [address] [place], [place] [address] [phone]

## 2022-07-08 LAB — CMP14+EGFR
ALT: 18 IU/L (ref 0–32)
AST: 20 IU/L (ref 0–40)
Albumin: 4.5 g/dL (ref 3.8–4.9)
Alkaline Phosphatase: 76 IU/L (ref 44–121)
BUN/Creatinine Ratio: 22 (ref 9–23)
BUN: 20 mg/dL (ref 6–24)
Bilirubin Total: 0.2 mg/dL (ref 0.0–1.2)
CO2: 25 mmol/L (ref 20–29)
Calcium: 9.4 mg/dL (ref 8.7–10.2)
Chloride: 107 mmol/L — ABNORMAL HIGH (ref 96–106)
Creatinine, Ser: 0.9 mg/dL (ref 0.57–1.00)
Globulin, Total: 2.3 g/dL (ref 1.5–4.5)
Glucose: 81 mg/dL (ref 70–99)
Potassium: 4.4 mmol/L (ref 3.5–5.2)
Sodium: 145 mmol/L — ABNORMAL HIGH (ref 134–144)
Total Protein: 6.8 g/dL (ref 6.0–8.5)
eGFR: 75 mL/min/{1.73_m2} (ref >59)

## 2022-07-08 LAB — LIPID PANEL
Chol/HDL Ratio: 2.2 ratio (ref 0.0–4.4)
Cholesterol, Total: 141 mg/dL (ref 100–199)
HDL: 65 mg/dL (ref >39)
LDL Chol Calc (NIH): 60 mg/dL (ref 0–99)
Triglycerides: 82 mg/dL (ref 0–149)
VLDL Cholesterol Cal: 16 mg/dL (ref 5–40)

## 2022-07-08 LAB — CBC WITH DIFFERENTIAL/PLATELET
Basophils Absolute: 0.1 10*3/uL (ref 0.0–0.2)
Basos: 1 %
EOS (ABSOLUTE): 0.2 10*3/uL (ref 0.0–0.4)
Eos: 4 %
Hematocrit: 43 % (ref 34.0–46.6)
Hemoglobin: 14.2 g/dL (ref 11.1–15.9)
Immature Grans (Abs): 0 10*3/uL (ref 0.0–0.1)
Immature Granulocytes: 0 %
Lymphocytes Absolute: 4.1 10*3/uL — ABNORMAL HIGH (ref 0.7–3.1)
Lymphs: 65 %
MCH: 30.9 pg (ref 26.6–33.0)
MCHC: 33 g/dL (ref 31.5–35.7)
MCV: 94 fL (ref 79–97)
Monocytes Absolute: 0.4 10*3/uL (ref 0.1–0.9)
Monocytes: 6 %
Neutrophils Absolute: 1.5 10*3/uL (ref 1.4–7.0)
Neutrophils: 24 %
Platelets: 244 10*3/uL (ref 150–450)
RBC: 4.6 x10E6/uL (ref 3.77–5.28)
RDW: 12.6 % (ref 11.7–15.4)
WBC: 6.3 10*3/uL (ref 3.4–10.8)

## 2022-07-08 LAB — VITAMIN B12: Vitamin B-12: 655 pg/mL (ref 232–1245)

## 2022-07-08 LAB — VITAMIN D 25 HYDROXY (VIT D DEFICIENCY, FRACTURES): Vit D, 25-Hydroxy: 24.7 ng/mL — ABNORMAL LOW (ref 30.0–100.0)

## 2022-07-09 DIAGNOSIS — Z880 Allergy status to penicillin: Secondary | ICD-10-CM | POA: Diagnosis not present

## 2022-07-09 DIAGNOSIS — R6884 Jaw pain: Secondary | ICD-10-CM | POA: Diagnosis not present

## 2022-07-09 DIAGNOSIS — R220 Localized swelling, mass and lump, head: Secondary | ICD-10-CM | POA: Diagnosis not present

## 2022-07-09 DIAGNOSIS — T464X5A Adverse effect of angiotensin-converting-enzyme inhibitors, initial encounter: Secondary | ICD-10-CM | POA: Diagnosis not present

## 2022-07-09 DIAGNOSIS — I10 Essential (primary) hypertension: Secondary | ICD-10-CM | POA: Diagnosis not present

## 2022-07-09 DIAGNOSIS — E785 Hyperlipidemia, unspecified: Secondary | ICD-10-CM | POA: Diagnosis not present

## 2022-07-09 DIAGNOSIS — K0889 Other specified disorders of teeth and supporting structures: Secondary | ICD-10-CM | POA: Diagnosis not present

## 2022-07-10 MED ORDER — VITAMIN D (ERGOCALCIFEROL) 1.25 MG (50000 UNIT) PO CAPS
50000.0000 [IU] | ORAL_CAPSULE | ORAL | 0 refills | Status: DC
Start: 2022-07-10 — End: 2022-08-29

## 2022-07-10 NOTE — Progress Notes (Signed)
Slightly elevated lymphocytes, sodium, and chloride. Some variation in labs is expected. Will monitor on follow up labs. Vitamin D level is low. I have sent in a weekly supplement to take for the next 12 weeks. After that, take a daily OTC vitamin D supplement with 1000-2000 IU. Will repeat labs in 3-6 months

## 2022-07-10 NOTE — Addendum Note (Signed)
Addended by: Neale Burly on: 07/10/2022 09:09 PM   Modules accepted: Orders

## 2022-07-21 ENCOUNTER — Encounter: Payer: Self-pay | Admitting: *Deleted

## 2022-07-25 ENCOUNTER — Other Ambulatory Visit: Payer: Self-pay | Admitting: Family Medicine

## 2022-07-25 DIAGNOSIS — Z1231 Encounter for screening mammogram for malignant neoplasm of breast: Secondary | ICD-10-CM

## 2022-08-02 ENCOUNTER — Ambulatory Visit
Admission: RE | Admit: 2022-08-02 | Discharge: 2022-08-02 | Disposition: A | Discharge status: Home / Self Care | Payer: Medicaid Other | Source: Ambulatory Visit | Attending: Family Medicine | Admitting: Family Medicine

## 2022-08-02 DIAGNOSIS — Z1231 Encounter for screening mammogram for malignant neoplasm of breast: Secondary | ICD-10-CM | POA: Diagnosis not present

## 2022-08-03 ENCOUNTER — Other Ambulatory Visit: Payer: Self-pay | Admitting: Family Medicine

## 2022-08-03 ENCOUNTER — Telehealth: Payer: Self-pay | Admitting: Family Medicine

## 2022-08-03 DIAGNOSIS — R232 Flushing: Secondary | ICD-10-CM

## 2022-08-03 DIAGNOSIS — E782 Mixed hyperlipidemia: Secondary | ICD-10-CM

## 2022-08-03 MED ORDER — FLUOXETINE HCL 20 MG PO CAPS: 20.0000 mg | ORAL_CAPSULE | Freq: Every day | ORAL | 0 refills | Status: DC

## 2022-08-03 MED ORDER — PRAVASTATIN SODIUM 40 MG PO TABS
40.0000 mg | ORAL_TABLET | Freq: Every evening | ORAL | 0 refills | Status: DC
Start: 2022-08-03 — End: 2022-08-09

## 2022-08-03 NOTE — Telephone Encounter (Signed)
Please let the patient know that I sent their prescription to their pharmacy. Thanks, WS 

## 2022-08-03 NOTE — Telephone Encounter (Signed)
  Prescription Request  08/03/2022  Is this a "Controlled Substance" medicine? NO  Have you seen your PCP in the last 2 weeks? NO, PT has appt with Jerrel Ivory next week but is out of all her meds  If YES, route message to pool  -  If NO, patient needs to be scheduled for appointment.  What is the name of the medication or equipment? pravastatin (PRAVACHOL) 40 MG tablet  amLODipine (NORVASC) 5 MG tablet  FLUoxetine (PROZAC) 20 MG capsule   Have you contacted your pharmacy to request a refill? yes   Which pharmacy would you like this sent to? [facility]   Patient notified that their request is being sent to the clinical staff for review and that they should receive a response within 2 business days.   Pt was seen at ER on 07/09/2022 and was taken off lisinopril (ZESTRIL) 40 MG tablet  because it made her face swell.Pt wants to speak to nurse about this

## 2022-08-03 NOTE — Telephone Encounter (Signed)
No answer and vmail is full. Pharmacy should notify pt

## 2022-08-08 ENCOUNTER — Ambulatory Visit: Payer: Medicaid Other | Admitting: Family Medicine

## 2022-08-08 NOTE — Progress Notes (Signed)
Normal mammogram.  Repeat 1 year.

## 2022-08-09 ENCOUNTER — Encounter: Payer: Self-pay | Admitting: Family Medicine

## 2022-08-09 ENCOUNTER — Ambulatory Visit (INDEPENDENT_AMBULATORY_CARE_PROVIDER_SITE_OTHER): Payer: Medicaid Other | Admitting: Family Medicine

## 2022-08-09 VITALS — BP 114/75 | HR 64 | Temp 98.2°F | Ht 59.0 in | Wt 91.0 lb

## 2022-08-09 DIAGNOSIS — E782 Mixed hyperlipidemia: Secondary | ICD-10-CM

## 2022-08-09 DIAGNOSIS — R232 Flushing: Secondary | ICD-10-CM | POA: Diagnosis not present

## 2022-08-09 DIAGNOSIS — E559 Vitamin D deficiency, unspecified: Secondary | ICD-10-CM | POA: Diagnosis not present

## 2022-08-09 DIAGNOSIS — M79642 Pain in left hand: Secondary | ICD-10-CM

## 2022-08-09 DIAGNOSIS — R634 Abnormal weight loss: Secondary | ICD-10-CM | POA: Diagnosis not present

## 2022-08-09 DIAGNOSIS — M79641 Pain in right hand: Secondary | ICD-10-CM

## 2022-08-09 DIAGNOSIS — Z720 Tobacco use: Secondary | ICD-10-CM | POA: Diagnosis not present

## 2022-08-09 DIAGNOSIS — I10 Essential (primary) hypertension: Secondary | ICD-10-CM

## 2022-08-09 MED ORDER — AMLODIPINE BESYLATE 5 MG PO TABS
5.0000 mg | ORAL_TABLET | Freq: Every day | ORAL | 0 refills | Status: DC
Start: 2022-08-09 — End: 2022-12-06

## 2022-08-09 MED ORDER — FLUOXETINE HCL 20 MG PO CAPS
20.0000 mg | ORAL_CAPSULE | Freq: Every day | ORAL | 0 refills | Status: DC
Start: 2022-08-09 — End: 2022-12-06

## 2022-08-09 MED ORDER — PRAVASTATIN SODIUM 40 MG PO TABS
40.0000 mg | ORAL_TABLET | Freq: Every evening | ORAL | 0 refills | Status: DC
Start: 2022-08-09 — End: 2022-12-06

## 2022-08-09 NOTE — Progress Notes (Signed)
Acute Office Visit  Subjective:  Patient ID: Laura Golden, female    DOB: [birth year], 57 y.o.   MRN: [id number]  Chief Complaint  Patient presents with   Medical Management of Chronic Issues   HPI Primary hypertension Patient is in today for management of Chronic Conditions  Patient was recently seen in the ED for angioedema. She was taken off of Lisinopril. She is taking amlodipine and doing well on it. She is doing much better now. States that she is taking her BP at home and averaging the same as in office today. She did not bring BP log with her today.   Weight Loss  States that she is still losing weight and is concerned. Reports that she is still eating.  Has lost 4 lbs since visit on 03/30/22. On 09/28/2021 101 lbs  Continues to smoke.   Mixed hyperlipidemia Endorses feeding intolerance. There is a family history of hyperlipidemia. There is not a family history of early ischemia heart disease.  Hot flashes States she still has hot flashes. She has to wear a towel and is constantly wiping sweat.   Hand pain  States that she has had hand pain in bilateral hands for years. Reports that years ago she was given a brace, but she cannot find her brace. States that she is trying extra strength tylenol and it is not helping her. She is trying to avoid ibuprofen.   Vitamin D  Has not been able to afford daily vitamin d supplement. Previously sent in weekly supply. Unsure if patient picked it up.   ROS As per HPI  Objective:  BP 114/75   Pulse 64   Temp 98.2 F (36.8 C)   Ht 4\' 11"  (1.499 m)   Wt 91 lb (41.3 kg)   SpO2 98%   BMI 18.38 kg/m   Physical Exam Constitutional:      General: She is awake. She is not in acute distress.    Appearance: Normal appearance. She is well-developed, well-groomed and underweight. She is ill-appearing. She is not toxic-appearing or diaphoretic.     Comments: Chronically ill appearing   HENT:     Mouth/Throat:     Lips: Pink.      Dentition: Abnormal dentition.     Tongue: No lesions.     Palate: No mass.  Cardiovascular:     Rate and Rhythm: Normal rate.     Pulses: Normal pulses.          Radial pulses are 2+ on the right side and 2+ on the left side.       Posterior tibial pulses are 2+ on the right side and 2+ on the left side.     Heart sounds: Normal heart sounds. No murmur heard.    No gallop.  Pulmonary:     Effort: Pulmonary effort is normal. No respiratory distress.     Breath sounds: Normal breath sounds. No stridor. No wheezing, rhonchi or rales.  Musculoskeletal:     Cervical back: Full passive range of motion without pain and neck supple.     Right lower leg: No edema.     Left lower leg: No edema.  Skin:    General: Skin is warm.     Capillary Refill: Capillary refill takes less than 2 seconds.  Neurological:     General: No focal deficit present.     Mental Status: She is alert, oriented to person, place, and time and easily aroused. Mental status is at baseline.  GCS: GCS eye subscore is 4. GCS verbal subscore is 5. GCS motor subscore is 6.     Motor: No weakness.  Psychiatric:        Attention and Perception: Attention and perception normal.        Mood and Affect: Mood and affect normal.        Speech: Speech normal.        Behavior: Behavior normal. Behavior is cooperative.        Thought Content: Thought content normal. Thought content does not include homicidal or suicidal ideation. Thought content does not include homicidal or suicidal plan.        Cognition and Memory: Cognition and memory normal.        Judgment: Judgment normal.       08/09/2022    8:46 AM 07/06/2022    8:07 AM 03/30/2022    8:55 AM  Depression screen PHQ 2/9  Decreased Interest 3 3 3   Down, Depressed, Hopeless 0 0 0  PHQ - 2 Score 3 3 3   Altered sleeping 0 0 0  Tired, decreased energy 0 0 0  Change in appetite 0 0 0  Feeling bad or failure about yourself  0 0 0  Trouble concentrating 0 0 0  Moving  slowly or fidgety/restless 0 0 0  Suicidal thoughts 0 0 0  PHQ-9 Score 3 3 3   Difficult doing work/chores  Not difficult at all Somewhat difficult      08/09/2022    8:46 AM 07/06/2022    8:08 AM 03/30/2022    8:55 AM 09/05/2021    9:15 AM  GAD 7 : Generalized Anxiety Score  Nervous, Anxious, on Edge 0 0 0 0  Control/stop worrying 0 0 0 0  Worry too much - different things 0 0 0 0  Trouble relaxing 0 0 0 0  Restless 0 0 0 0  Easily annoyed or irritable 0 0 0 0  Afraid - awful might happen 0 0 0 0  Total GAD 7 Score 0 0 0 0  Anxiety Difficulty Not difficult at all Not difficult at all      Assessment & Plan:  1. Primary hypertension Well controlled on current regimen. Refill as below.  - amLODipine (NORVASC) 5 MG tablet; Take 1 tablet (5 mg total) by mouth daily.  Dispense: 90 tablet; Refill: 0  2. Mixed hyperlipidemia Well controlled on labs 07/06/22. ASCVD risk remains low.  - pravastatin (PRAVACHOL) 40 MG tablet; Take 1 tablet (40 mg total) by mouth every evening.  Dispense: 90 tablet; Refill: 0 The 10-year ASCVD risk score (Arnett DK, et al., 2019) is: 4%   Values used to calculate the score:     Age: 79 years     Sex: Female     Is Non-Hispanic African American: Yes     Diabetic: No     Tobacco smoker: Yes     Systolic Blood Pressure: 114 mmHg     Is BP treated: Yes     HDL Cholesterol: 65 mg/dL     Total Cholesterol: 141 mg/dL  3. Hot flashes Refill as below. Not at goal. Patient would like to continue current regimen.  - FLUoxetine (PROZAC) 20 MG capsule; Take 1 capsule (20 mg total) by mouth daily.  Dispense: 90 capsule; Refill: 0  4. Tobacco abuse Encouraged cessation. Patient not ready to quit at this time.   5. Weight loss, unintentional Patient to follow up for weight loss visit. Discussed with patient  increasing protein in diet.  Reviewed vital signs.   09/28/21 101 lbs 6.4 oz  03/30/22 97 lb 3.2 oz 07/3122 91 lb   6. Vitamin D deficiency Instructed  patient to call pharmacy to see if prescription is there for her. Patient to notify provider if needs to be resent.   7. Pain in both hands Discussed with patient that she can take OTC analgesics sparingly and to monitor her BP.   The above assessment and management plan was discussed with the patient. The patient verbalized understanding of and has agreed to the management plan using shared-decision making. Patient is aware to call the clinic if they develop any new symptoms or if symptoms fail to improve or worsen. Patient is aware when to return to the clinic for a follow-up visit. Patient educated on when it is appropriate to go to the emergency department.   Return in about 3 months (around 11/09/2022) for Chronic Condition Follow up.  Neale Burly, DNP-FNP [facility] [address] [place], [place] [address] [phone]

## 2022-08-15 ENCOUNTER — Encounter: Payer: Self-pay | Admitting: *Deleted

## 2022-08-17 ENCOUNTER — Ambulatory Visit: Payer: Medicaid Other | Admitting: Family Medicine

## 2022-08-24 ENCOUNTER — Encounter: Payer: Self-pay | Admitting: Family Medicine

## 2022-08-24 ENCOUNTER — Ambulatory Visit: Payer: Medicaid Other | Admitting: Family Medicine

## 2022-08-29 ENCOUNTER — Encounter: Payer: Self-pay | Admitting: Family Medicine

## 2022-08-29 ENCOUNTER — Ambulatory Visit (INDEPENDENT_AMBULATORY_CARE_PROVIDER_SITE_OTHER): Payer: Medicaid Other

## 2022-08-29 ENCOUNTER — Ambulatory Visit (INDEPENDENT_AMBULATORY_CARE_PROVIDER_SITE_OTHER): Payer: Medicaid Other | Admitting: Family Medicine

## 2022-08-29 VITALS — BP 108/80 | HR 69 | Ht 59.0 in | Wt 96.0 lb

## 2022-08-29 DIAGNOSIS — Z720 Tobacco use: Secondary | ICD-10-CM | POA: Diagnosis not present

## 2022-08-29 DIAGNOSIS — R634 Abnormal weight loss: Secondary | ICD-10-CM

## 2022-08-29 DIAGNOSIS — E559 Vitamin D deficiency, unspecified: Secondary | ICD-10-CM

## 2022-08-29 DIAGNOSIS — Z5986 Financial insecurity: Secondary | ICD-10-CM | POA: Diagnosis not present

## 2022-08-29 MED ORDER — VITAMIN D (ERGOCALCIFEROL) 1.25 MG (50000 UNIT) PO CAPS: 50000.0000 [IU] | ORAL_CAPSULE | ORAL | 0 refills | Status: AC

## 2022-08-29 NOTE — Progress Notes (Signed)
No active disease to explain weight loss. Will continue evaluation.

## 2022-08-29 NOTE — Progress Notes (Signed)
Acute Office Visit  Subjective:  Patient ID: Laura Golden, female    DOB: 1965/12/11, 57 y.o.   MRN: [id number]  Chief Complaint  Patient presents with   Medical Management of Chronic Issues   Weight Loss   HPI Patient is in today for follow up of weight loss  Patient weight 09/28/21 = 101 lbs  03/30/22 = 97 lbs  07/06/22 = 93 lbs  08/09/22 = 91 lbs   Social issues/Eating habits  Reports financial strain and that she is not able to afford her medications at the moment. States that she is not taking most of her medications due to the cost. In addition, she may not be able to afford her appts and meals. 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 Reports that she has applied in the past, but has been denied.  She receives meals from meals on wheels. In addition, reports that she is eating low salt and low seasoning foods due to her history of hypertension.  States that she made additional changes to her diet when she found out she was prediabetic, such as cutting out potatoes.   Exercise  Reports that she walks approximately 2.5 hours every night. She stops and talks often but she walks until she is sore.   Screenings  Tobacco Use  1 Pack per week  Started smoking at 57 years old  Ordered Lung Cancer screening at prior visit, but patient has not received call.  States that she does not have have a lot of trouble breathing. Sometimes she will have to use inhaler. Clarifies this as once per month. States that in the past she was told she has chronic bronchitis, but not COPD. Her sister has COPD.   Cologuard is UTD   Has history of Total abdominal hysterectomy, therefore does not require PAP screen   ROS As per HPI   Objective:  BP 108/80   Pulse 69   Ht 4\' 11"  (1.499 m)   Wt 96 lb (43.5 kg)   SpO2 96%   BMI 19.39 kg/m   Physical Exam Constitutional:      General: She is awake. She is not in acute distress.    Appearance: Normal appearance. She is well-developed,  well-groomed and normal weight. She is not ill-appearing, toxic-appearing or diaphoretic.  HENT:     Mouth/Throat:     Dentition: Abnormal dentition.  Cardiovascular:     Rate and Rhythm: Normal rate and regular rhythm.     Pulses: Normal pulses.          Radial pulses are 2+ on the right side and 2+ on the left side.       Posterior tibial pulses are 2+ on the right side and 2+ on the left side.     Heart sounds: Normal heart sounds. No murmur heard.    No gallop.  Pulmonary:     Effort: Pulmonary effort is normal. No respiratory distress.     Breath sounds: Normal breath sounds. No stridor. No wheezing, rhonchi or rales.  Musculoskeletal:     Cervical back: Full passive range of motion without pain and neck supple.     Right lower leg: No edema.     Left lower leg: No edema.  Skin:    General: Skin is warm.     Capillary Refill: Capillary refill takes less than 2 seconds.  Neurological:     General: No focal deficit present.     Mental Status: She is alert,  oriented to person, place, and time and easily aroused. Mental status is at baseline.     GCS: GCS eye subscore is 4. GCS verbal subscore is 5. GCS motor subscore is 6.     Motor: No weakness.  Psychiatric:        Attention and Perception: Attention and perception normal.        Mood and Affect: Mood and affect normal.        Speech: Speech normal.        Behavior: Behavior normal. Behavior is cooperative.        Thought Content: Thought content normal. Thought content does not include homicidal or suicidal ideation. Thought content does not include homicidal or suicidal plan.        Cognition and Memory: Cognition and memory normal.        Judgment: Judgment normal.       08/29/2022    9:39 AM 08/09/2022    8:46 AM 07/06/2022    8:07 AM  Depression screen PHQ 2/9  Decreased Interest 3 3 3   Down, Depressed, Hopeless 0 0 0  PHQ - 2 Score 3 3 3   Altered sleeping 0 0 0  Tired, decreased energy 0 0 0  Change in appetite 0  0 0  Feeling bad or failure about yourself  0 0 0  Trouble concentrating 0 0 0  Moving slowly or fidgety/restless 0 0 0  Suicidal thoughts 0 0 0  PHQ-9 Score 3 3 3   Difficult doing work/chores Not difficult at all  Not difficult at all      08/29/2022    9:42 AM 08/09/2022    8:46 AM 07/06/2022    8:08 AM 03/30/2022    8:55 AM  GAD 7 : Generalized Anxiety Score  Nervous, Anxious, on Edge 0 0 0 0  Control/stop worrying 0 0 0 0  Worry too much - different things 0 0 0 0  Trouble relaxing 0 0 0 0  Restless 0 0 0 0  Easily annoyed or irritable 0 0 0 0  Afraid - awful might happen 0 0 0 0  Total GAD 7 Score 0 0 0 0  Anxiety Difficulty Not difficult at all Not difficult at all Not difficult at all    The 10-year ASCVD risk score (Arnett DK, et al., 2019) is: 3.3%   Values used to calculate the score:     Age: 30 years     Sex: Female     Is Non-Hispanic African American: Yes     Diabetic: No     Tobacco smoker: Yes     Systolic Blood Pressure: 108 mmHg     Is BP treated: Yes     HDL Cholesterol: 65 mg/dL     Total Cholesterol: 141 mg/dL  Assessment & Plan:  1. Weight loss, unintentional Patient weight improved from last visit, will collect labs and imaging as below. Will communicate results to patient once available. Will await results to determine next steps.  Cologuard UTD  Pap not applicable  - DG Chest 2 View; Future - HepB+HepC+HIV Panel - Sedimentation Rate - C-reactive protein - TSH - CBC with Differential/Platelet - CMP14+EGFR; Future  2. Financial insecurity Referral as below for food and medication assistance - Ambulatory referral to Social Work  3. Vitamin D deficiency Refill as below  - Vitamin D, Ergocalciferol, (DRISDOL) 1.25 MG (50000 UNIT) CAPS capsule; Take 1 capsule (50,000 Units total) by mouth every 7 (seven) days for 5 doses.  Dispense: 5 capsule; Refill: 0  4. Tobacco abuse Screening previously ordered. Encouraged patient to quit. Praised patient  on her reduced use.   The above assessment and management plan was discussed with the patient. The patient verbalized understanding of and has agreed to the management plan using shared-decision making. Patient is aware to call the clinic if they develop any new symptoms or if symptoms fail to improve or worsen. Patient is aware when to return to the clinic for a follow-up visit. Patient educated on when it is appropriate to go to the emergency department.   Return in about 3 months (around 11/29/2022) for Chronic Condition Follow up.  Neale Burly, DNP-FNP [facility] [address] [place], [place] [address] [phone]

## 2022-08-30 LAB — CBC WITH DIFFERENTIAL/PLATELET
Basophils Absolute: 0.1 10*3/uL (ref 0.0–0.2)
Basos: 1 %
EOS (ABSOLUTE): 0.2 10*3/uL (ref 0.0–0.4)
Eos: 4 %
Hematocrit: 47.3 % — ABNORMAL HIGH (ref 34.0–46.6)
Hemoglobin: 15.3 g/dL (ref 11.1–15.9)
Immature Grans (Abs): 0 10*3/uL (ref 0.0–0.1)
Immature Granulocytes: 0 %
Lymphocytes Absolute: 3 10*3/uL (ref 0.7–3.1)
Lymphs: 53 %
MCH: 30.7 pg (ref 26.6–33.0)
MCHC: 32.3 g/dL (ref 31.5–35.7)
MCV: 95 fL (ref 79–97)
Monocytes Absolute: 0.4 10*3/uL (ref 0.1–0.9)
Monocytes: 7 %
Neutrophils Absolute: 2 10*3/uL (ref 1.4–7.0)
Neutrophils: 35 %
Platelets: 246 10*3/uL (ref 150–450)
RBC: 4.98 x10E6/uL (ref 3.77–5.28)
RDW: 12.5 % (ref 11.7–15.4)
WBC: 5.7 10*3/uL (ref 3.4–10.8)

## 2022-08-30 LAB — HEPB+HEPC+HIV PANEL
HIV Screen 4th Generation wRfx: NONREACTIVE
Hep B C IgM: NEGATIVE
Hep B Core Total Ab: NEGATIVE
Hep B E Ab: NONREACTIVE
Hep B E Ag: NEGATIVE
Hep B Surface Ab, Qual: NONREACTIVE
Hep C Virus Ab: NONREACTIVE
Hepatitis B Surface Ag: NEGATIVE

## 2022-08-30 LAB — SEDIMENTATION RATE: Sed Rate: 2 mm/hr (ref 0–40)

## 2022-08-30 LAB — C-REACTIVE PROTEIN: CRP: <1 mg/L (ref 0–10)

## 2022-08-30 LAB — TSH: TSH: 4.46 u[IU]/mL (ref 0.450–4.500)

## 2022-08-31 NOTE — Progress Notes (Signed)
Hematocrit slightly elevated, but stable. All other labs normal. Continue to monitor weight. Increase protein at home. Follow up if weight continues to decrease.

## 2022-09-03 DIAGNOSIS — M25561 Pain in right knee: Secondary | ICD-10-CM | POA: Diagnosis not present

## 2022-09-03 DIAGNOSIS — S0081XA Abrasion of other part of head, initial encounter: Secondary | ICD-10-CM | POA: Diagnosis not present

## 2022-09-03 DIAGNOSIS — M79601 Pain in right arm: Secondary | ICD-10-CM | POA: Diagnosis not present

## 2022-09-03 DIAGNOSIS — Z880 Allergy status to penicillin: Secondary | ICD-10-CM | POA: Diagnosis not present

## 2022-09-03 DIAGNOSIS — Z79899 Other long term (current) drug therapy: Secondary | ICD-10-CM | POA: Diagnosis not present

## 2022-09-03 DIAGNOSIS — I10 Essential (primary) hypertension: Secondary | ICD-10-CM | POA: Diagnosis not present

## 2022-09-03 DIAGNOSIS — S6991XA Unspecified injury of right wrist, hand and finger(s), initial encounter: Secondary | ICD-10-CM | POA: Diagnosis not present

## 2022-09-03 DIAGNOSIS — W01198A Fall on same level from slipping, tripping and stumbling with subsequent striking against other object, initial encounter: Secondary | ICD-10-CM | POA: Diagnosis not present

## 2022-09-03 DIAGNOSIS — R220 Localized swelling, mass and lump, head: Secondary | ICD-10-CM | POA: Diagnosis not present

## 2022-09-03 DIAGNOSIS — S0031XA Abrasion of nose, initial encounter: Secondary | ICD-10-CM | POA: Diagnosis not present

## 2022-09-03 DIAGNOSIS — E785 Hyperlipidemia, unspecified: Secondary | ICD-10-CM | POA: Diagnosis not present

## 2022-09-03 DIAGNOSIS — S0990XA Unspecified injury of head, initial encounter: Secondary | ICD-10-CM | POA: Diagnosis not present

## 2022-09-06 ENCOUNTER — Ambulatory Visit (INDEPENDENT_AMBULATORY_CARE_PROVIDER_SITE_OTHER): Payer: Medicaid Other | Admitting: Family Medicine

## 2022-09-06 ENCOUNTER — Ambulatory Visit: Payer: Medicaid Other | Admitting: Nurse Practitioner

## 2022-09-06 ENCOUNTER — Encounter: Payer: Self-pay | Admitting: Family Medicine

## 2022-09-06 ENCOUNTER — Other Ambulatory Visit: Payer: Self-pay

## 2022-09-06 VITALS — BP 105/72 | HR 86 | Temp 98.2°F | Ht 59.0 in | Wt 96.0 lb

## 2022-09-06 DIAGNOSIS — I10 Essential (primary) hypertension: Secondary | ICD-10-CM | POA: Diagnosis not present

## 2022-09-06 DIAGNOSIS — W19XXXD Unspecified fall, subsequent encounter: Secondary | ICD-10-CM

## 2022-09-06 NOTE — Progress Notes (Signed)
Subjective:  Patient ID: Laura Golden, female    DOB: 01-Apr-1965, 57 y.o.   MRN: [id number]  Patient Care Team: Arrie Senate, FNP as PCP - General (Family Medicine)   Chief Complaint:  Follow-up (Fall, right shoulder injury)  HPI: Laura Golden is a 57 y.o. female presenting on 09/06/2022 for Follow-up (Fall, right shoulder injury)  HPI Fall 09/02/22 States that she was walking and talking to someone. She looked back and as she turned back forward she tripped over a log. She was not able to catch herself, so she hit on her right shoulder, hand, and chin. Reports that her knee is sore and has some scrapping.  Left knee is gets sore and unsteady as she stands for too long.  She presented to [facility] in [place] on 09/03/22 She is currently wearing a sling and was told to wear it by ED States that she is unable to lift her arm to shoulder length.  Denies trauma to head. Denies confusion.  She is taking tylenol Extra Strength. States that it is not really helping.  States that it is a 6/10 currently. Is not taking ibuprofen as she is scared it will increase his BP.  States that she takes her arm out of sling on occasion to do things like washing dishes. Is sleeping on her left side.   Relevant past medical, surgical, family, and social history reviewed and updated as indicated.  Allergies and medications reviewed and updated. Data reviewed: Chart in Epic.  Past Medical History:  Diagnosis Date   Hypertension    Mixed hyperlipidemia 04/20/2020    Past Surgical History:  Procedure Laterality Date   foot sx     TOTAL ABDOMINAL HYSTERECTOMY      Social History   Socioeconomic History   Marital status: Single    Spouse name: Not on file   Number of children: Not on file   Years of education: Not on file   Highest education level: Not on file  Occupational History   Not on file  Tobacco Use   Smoking status: Every Day    Current packs/day: 0.25    Average  packs/day: 0.2 packs/day for 41.1 years (10.3 ttl pk-yrs)    Types: Cigarettes    Start date: 07/21/1981   Smokeless tobacco: Never  Vaping Use   Vaping status: Never Used  Substance and Sexual Activity   Alcohol use: Yes    Comment: occ   Drug use: Never   Sexual activity: Not Currently  Other Topics Concern   Not on file  Social History Narrative   Not on file   Social Determinants of Health   Financial Resource Strain: Not on file  Food Insecurity: Not on file  Transportation Needs: Not on file  Physical Activity: Not on file  Stress: Not on file  Social Connections: Not on file  Intimate Partner Violence: Not on file    Outpatient Encounter Medications as of 09/06/2022  Medication Sig   acetaminophen (TYLENOL) 325 MG tablet Take 325 mg by mouth every 6 (six) hours as needed for mild pain or headache.   albuterol (VENTOLIN HFA) 108 (90 Base) MCG/ACT inhaler Inhale 2 puffs into the lungs every 6 (six) hours as needed for wheezing or shortness of breath.   amLODipine (NORVASC) 5 MG tablet Take 1 tablet (5 mg total) by mouth daily.   aspirin 325 MG tablet Take 325 mg by mouth daily.   cetirizine (ZYRTEC) 10 MG tablet Take  1 tablet (10 mg total) by mouth daily.   FLUoxetine (PROZAC) 20 MG capsule Take 1 capsule (20 mg total) by mouth daily.   fluticasone (FLONASE) 50 MCG/ACT nasal spray Place 2 sprays into both nostrils daily.   pravastatin (PRAVACHOL) 40 MG tablet Take 1 tablet (40 mg total) by mouth every evening.   triamcinolone cream (KENALOG) 0.1 % Apply 1 application. topically 2 (two) times daily.   Vitamin D, Ergocalciferol, (DRISDOL) 1.25 MG (50000 UNIT) CAPS capsule Take 1 capsule (50,000 Units total) by mouth every 7 (seven) days for 5 doses.   No facility-administered encounter medications on file as of 09/06/2022.    Allergies  Allergen Reactions   Lisinopril Swelling    Facial swelling    Penicillins    Review of Systems As per HPI   Objective:  BP  105/72   Pulse 86   Temp 98.2 F (36.8 C)   Ht 4\' 11"  (1.499 m)   Wt 96 lb (43.5 kg)   SpO2 97%   BMI 19.39 kg/m    Wt Readings from Last 3 Encounters:  09/06/22 96 lb (43.5 kg)  08/29/22 96 lb (43.5 kg)  08/09/22 91 lb (41.3 kg)   Physical Exam Constitutional:      General: She is awake. She is not in acute distress.    Appearance: Normal appearance. She is well-developed and well-groomed. She is not ill-appearing, toxic-appearing or diaphoretic.  Cardiovascular:     Rate and Rhythm: Normal rate and regular rhythm.     Pulses: Normal pulses.          Radial pulses are 2+ on the right side and 2+ on the left side.       Posterior tibial pulses are 2+ on the right side and 2+ on the left side.     Heart sounds: Normal heart sounds. No murmur heard.    No gallop.  Pulmonary:     Effort: Pulmonary effort is normal. No respiratory distress.     Breath sounds: Normal breath sounds. No stridor. No wheezing, rhonchi or rales.  Abdominal:     General: Abdomen is flat. Bowel sounds are normal.     Palpations: Abdomen is soft.  Musculoskeletal:     Right shoulder: Tenderness and bony tenderness present. No swelling, deformity or effusion. Decreased range of motion. Decreased strength. Normal pulse.     Left shoulder: Normal. No swelling.     Right upper arm: Tenderness and bony tenderness present. No swelling, edema, deformity or lacerations.     Right elbow: No swelling, deformity, effusion or lacerations. Decreased range of motion. Tenderness present in medial epicondyle and lateral epicondyle.     Right hand: Swelling and tenderness present. Decreased range of motion. Decreased strength.     Cervical back: Full passive range of motion without pain and neck supple.     Right knee: Decreased range of motion.     Left knee: Decreased range of motion.     Right lower leg: No edema.     Left lower leg: No edema.     Comments: Decreased ROM bilateral knees, right shoulder, right elbow,  right fingers due to pain.   Skin:    General: Skin is warm.     Capillary Refill: Capillary refill takes less than 2 seconds.     Comments: Abrasion with excoriated tissue on chin and nose   Neurological:     General: No focal deficit present.     Mental Status: She is  alert, oriented to person, place, and time and easily aroused. Mental status is at baseline.     GCS: GCS eye subscore is 4. GCS verbal subscore is 5. GCS motor subscore is 6.     Motor: No weakness.  Psychiatric:        Attention and Perception: Attention and perception normal.        Mood and Affect: Mood and affect normal.        Speech: Speech normal.        Behavior: Behavior normal. Behavior is cooperative.        Thought Content: Thought content normal. Thought content does not include homicidal or suicidal ideation. Thought content does not include homicidal or suicidal plan.        Cognition and Memory: Cognition and memory normal.        Judgment: Judgment normal.    Results for orders placed or performed in visit on 08/29/22  HepB+HepC+HIV Panel  Result Value Ref Range   Hepatitis B Surface Ag Negative Negative   Hep B E Ag Negative Negative   Hep B C IgM Negative Negative   Hep B Core Total Ab Negative Negative   Hep B E Ab Non Reactive Negative   Hep B Surface Ab, Qual Non Reactive    Hep C Virus Ab Non Reactive Non Reactive   HIV Screen 4th Generation wRfx Non Reactive Non Reactive  Sedimentation Rate  Result Value Ref Range   Sed Rate 2 0 - 40 mm/hr  C-reactive protein  Result Value Ref Range   CRP <1 0 - 10 mg/L  TSH  Result Value Ref Range   TSH 4.460 0.450 - 4.500 uIU/mL  CBC with Differential/Platelet  Result Value Ref Range   WBC 5.7 3.4 - 10.8 x10E3/uL   RBC 4.98 3.77 - 5.28 x10E6/uL   Hemoglobin 15.3 11.1 - 15.9 g/dL   Hematocrit 56.4 (H) 33.2 - 46.6 %   MCV 95 79 - 97 fL   MCH 30.7 26.6 - 33.0 pg   MCHC 32.3 31.5 - 35.7 g/dL   RDW 95.1 88.4 - 16.6 %   Platelets 246 150 - 450  x10E3/uL   Neutrophils 35 Not Estab. %   Lymphs 53 Not Estab. %   Monocytes 7 Not Estab. %   Eos 4 Not Estab. %   Basos 1 Not Estab. %   Neutrophils Absolute 2.0 1.4 - 7.0 x10E3/uL   Lymphocytes Absolute 3.0 0.7 - 3.1 x10E3/uL   Monocytes Absolute 0.4 0.1 - 0.9 x10E3/uL   EOS (ABSOLUTE) 0.2 0.0 - 0.4 x10E3/uL   Basophils Absolute 0.1 0.0 - 0.2 x10E3/uL   Immature Granulocytes 0 Not Estab. %   Immature Grans (Abs) 0.0 0.0 - 0.1 x10E3/uL   Hematology Comments: Note:           09/06/2022   11:07 AM 08/29/2022    9:39 AM 08/09/2022    8:46 AM 07/06/2022    8:07 AM 03/30/2022    8:55 AM  Depression screen PHQ 2/9  Decreased Interest 3 3 3 3 3   Down, Depressed, Hopeless 0 0 0 0 0  PHQ - 2 Score 3 3 3 3 3   Altered sleeping 0 0 0 0 0  Tired, decreased energy 0 0 0 0 0  Change in appetite 0 0 0 0 0  Feeling bad or failure about yourself  0 0 0 0 0  Trouble concentrating 0 0 0 0 0  Moving slowly or fidgety/restless  0 0 0 0 0  Suicidal thoughts 0 0 0 0 0  PHQ-9 Score 3 3 3 3 3   Difficult doing work/chores Not difficult at all Not difficult at all  Not difficult at all Somewhat difficult       09/06/2022   11:07 AM 08/29/2022    9:42 AM 08/09/2022    8:46 AM 07/06/2022    8:08 AM  GAD 7 : Generalized Anxiety Score  Nervous, Anxious, on Edge 0 0 0 0  Control/stop worrying 0 0 0 0  Worry too much - different things 0 0 0 0  Trouble relaxing 0 0 0 0  Restless 0 0 0 0  Easily annoyed or irritable 0 0 0 0  Afraid - awful might happen 0 0 0 0  Total GAD 7 Score 0 0 0 0  Anxiety Difficulty Not difficult at all Not difficult at all Not difficult at all Not difficult at all    Pertinent labs & imaging results that were available during my care of the patient were reviewed by me and considered in my medical decision making.  Assessment & Plan:  Laura Golden was seen today for follow-up.  Diagnoses and all orders for this visit:  Injury due to fall, subsequent encounter Referral placed as  below. Patient BP well controlled. Recommend that she take NSAIDs sparingly. Discussed use and risk of sling. Reviewed imaging from [facility]. Declines referral to PT at this time.  -     Ambulatory referral to Orthopedic Surgery  XR Shoulder 3 or More Views Right  IMPRESSION:  Negative radiographs of the right shoulder.    Electronically Signed    By: Narda Rutherford M.D.    On: 09/03/2022 16:02   XR Knee 4 or more views right  IMPRESSION:  Negative radiographs of the right knee.    Electronically Signed    By: Narda Rutherford M.D.    On: 09/03/2022 16:0   XR Hand 3 or More Views Right  IMPRESSION:  Soft tissue edema without fracture or dislocation.    Electronically Signed    By: Narda Rutherford M.D.    On: 09/03/2022 16:01   Primary hypertension Well controlled. May utilize NSAIDs for pain control.   Continue all other maintenance medications.  Follow up plan: Return if symptoms worsen or fail to improve.   Continue healthy lifestyle choices, including diet (rich in fruits, vegetables, and lean proteins, and low in salt and simple carbohydrates) and exercise (at least 30 minutes of moderate physical activity daily).  Written and verbal instructions provided   The above assessment and management plan was discussed with the patient. The patient verbalized understanding of and has agreed to the management plan. Patient is aware to call the clinic if they develop any new symptoms or if symptoms persist or worsen. Patient is aware when to return to the clinic for a follow-up visit. Patient educated on when it is appropriate to go to the emergency department.   Neale Burly, DNP-FNP [facility] [address] [place], [place] [address] [phone]

## 2022-09-22 ENCOUNTER — Encounter: Payer: Self-pay | Admitting: Podiatry

## 2022-09-22 ENCOUNTER — Ambulatory Visit (INDEPENDENT_AMBULATORY_CARE_PROVIDER_SITE_OTHER): Payer: Medicaid Other | Admitting: Podiatry

## 2022-09-22 DIAGNOSIS — M216X1 Other acquired deformities of right foot: Secondary | ICD-10-CM

## 2022-09-22 DIAGNOSIS — B351 Tinea unguium: Secondary | ICD-10-CM | POA: Diagnosis not present

## 2022-09-22 DIAGNOSIS — L84 Corns and callosities: Secondary | ICD-10-CM | POA: Diagnosis not present

## 2022-09-22 NOTE — Progress Notes (Signed)
Subjective:  Patient ID: Laura Golden, female    DOB: [birth year],   MRN: [id number]  Chief Complaint  Patient presents with   Nail Problem    Pt presents for rfc.    57 y.o. female presents for routine nail care and callus debridement. Relates she has not had a trimming in over a years and is well past due. Relates the areas have been very painful. Denies any other pedal complaints. Denies n/v/f/c.   Past Medical History:  Diagnosis Date   Hypertension    Mixed hyperlipidemia 04/20/2020    Objective:  Physical Exam: Vascular: DP/PT pulses 2/4 bilateral. CFT <3 seconds. Normal hair growth on digits. No edema.  Skin. No lacerations or abrasions bilateral feet. Scaling lesion noted bilateral plantar feet. Hyperkeratosis noted bilateral 5th metatarsal heads and medial hallux bilateral. Nails 1-5 are thickened discolored and elongated with subungual debris.  Musculoskeletal: MMT 5/5 bilateral lower extremities in DF, PF, Inversion and Eversion. Deceased ROM in DF of ankle joint.  Neurological: Sensation intact to light touch.   Assessment:   1. Dermatophytosis of nail   2. Callus of foot   3. Prominent metatarsal head, right         Plan:  Patient was evaluated and treated and all questions answered. -Mechanically debrided all nails 1-5 bilateral using sterile nail nipper and filed with dremel without incident  -Hyperkeratotic lesions x 3 were debrided without incident with chisel.  -Applied salycylic acid treatment to area with dressing. Advised to remove bandaging tomorrow.  -Answered all patient questions -Patient to return as needed for routine foot care.  -Patient advised to call the office if any problems or questions arise in the meantime.   Louann Sjogren, DPM

## 2022-09-28 ENCOUNTER — Ambulatory Visit: Payer: Medicaid Other | Admitting: Orthopaedic Surgery

## 2022-09-29 ENCOUNTER — Ambulatory Visit: Payer: Medicaid Other | Admitting: Orthopedic Surgery

## 2022-10-05 ENCOUNTER — Encounter: Payer: Self-pay | Admitting: Orthopaedic Surgery

## 2022-10-05 ENCOUNTER — Ambulatory Visit (INDEPENDENT_AMBULATORY_CARE_PROVIDER_SITE_OTHER): Payer: Medicaid Other | Admitting: Orthopaedic Surgery

## 2022-10-05 ENCOUNTER — Other Ambulatory Visit (INDEPENDENT_AMBULATORY_CARE_PROVIDER_SITE_OTHER): Payer: Medicaid Other

## 2022-10-05 VITALS — Ht 59.0 in | Wt 99.0 lb

## 2022-10-05 DIAGNOSIS — G8929 Other chronic pain: Secondary | ICD-10-CM

## 2022-10-05 DIAGNOSIS — M7541 Impingement syndrome of right shoulder: Secondary | ICD-10-CM | POA: Diagnosis not present

## 2022-10-05 DIAGNOSIS — M25521 Pain in right elbow: Secondary | ICD-10-CM

## 2022-10-05 MED ORDER — METHYLPREDNISOLONE ACETATE 40 MG/ML IJ SUSP
40.0000 mg | INTRAMUSCULAR | Status: AC | PRN
Start: 2022-10-05 — End: 2022-10-05
  Administered 2022-10-05: 40 mg via INTRA_ARTICULAR

## 2022-10-05 MED ORDER — BUPIVACAINE HCL 0.25 % IJ SOLN
4.0000 mL | INTRAMUSCULAR | Status: AC | PRN
Start: 2022-10-05 — End: 2022-10-05
  Administered 2022-10-05: 4 mL via INTRA_ARTICULAR

## 2022-10-05 MED ORDER — LIDOCAINE HCL 1 % IJ SOLN
0.5000 mL | INTRAMUSCULAR | Status: AC | PRN
Start: 2022-10-05 — End: 2022-10-05
  Administered 2022-10-05: .5 mL

## 2022-10-05 NOTE — Progress Notes (Signed)
Office Visit Note   Patient: Laura Golden           Date of Birth: [birth year]           MRN: [id number] Visit Date: 10/05/2022              Requested by: Arrie Senate, FNP [address] [place],  [place] [address] PCP: Arrie Senate, FNP   Assessment & Plan: Visit Diagnoses:  1. Chronic pain of right elbow   2. Impingement syndrome of right shoulder     Plan: Shoulder injection performed and improved range of motion.  She continue with the range of motion efforts and return if she has ongoing problems. Follow-Up Instructions: No follow-ups on file.   Orders:  Orders Placed This Encounter  Procedures   Large Joint Inj   XR Elbow 2 Views Right   No orders of the defined types were placed in this encounter.     Procedures: Large Joint Inj on 10/05/2022 11:42 AM Indications: pain Details: 22 G 1.5 in needle  Arthrogram: No  Medications: 4 mL bupivacaine 0.25 %; 40 mg methylPREDNISolone acetate 40 MG/ML; 0.5 mL lidocaine 1 % Outcome: tolerated well, no immediate complications Procedure, treatment alternatives, risks and benefits explained, specific risks discussed. Consent was given by the patient. Immediately prior to procedure a time out was called to verify the correct patient, procedure, equipment, support staff and site/side marked as required. Patient was prepped and draped in the usual sterile fashion.       Clinical Data: No additional findings.   Subjective: Chief Complaint  Patient presents with   Right Shoulder - Pain    Low range of motion unable to straighten fell in August     HPI 57 year old female fell in August injured her shoulder was seen in the emergency room had x-rays obtained 09/03/2022 which were negative.  She has had persistent soreness difficulty with range of motion problems with the internal rotation with her hand past posterior axillary line some sharp pain with attempts at abduction and she can just get her hand  touching her forehead.  Review of Systems all other systems are noncontributory.  Positive smoker hypertension hyperlipidemia noted.   Objective: Vital Signs: Ht 4\' 11"  (1.499 m)   Wt 99 lb (44.9 kg)   BMI 20.00 kg/m   Physical Exam Constitutional:      Appearance: She is well-developed.  HENT:     Head: Normocephalic.     Right Ear: External ear normal.     Left Ear: External ear normal. There is no impacted cerumen.  Eyes:     Pupils: Pupils are equal, round, and reactive to light.  Neck:     Thyroid: No thyromegaly.     Trachea: No tracheal deviation.  Cardiovascular:     Rate and Rhythm: Normal rate.  Pulmonary:     Effort: Pulmonary effort is normal.  Abdominal:     Palpations: Abdomen is soft.  Musculoskeletal:     Cervical back: No rigidity.  Skin:    General: Skin is warm and dry.  Neurological:     Mental Status: She is alert and oriented to person, place, and time.  Psychiatric:        Behavior: Behavior normal.     Ortho Exam cause impingement right shoulder good cervical range of motion no brachioplexus tenderness negative Spurling.  Reflexes are normal.  Long head of the biceps is tender.  She has good range of motion  of the elbow mild tenderness of the elbow.  Specialty Comments:  No specialty comments available.  Imaging: XR Elbow 2 Views Right  Result Date: 10/05/2022 2 view x-rays right elbow shows slight peaking of the coronoid process maintained joint space and no subluxation.  No soft tissue calcification. Impression: Minimal coronoid spurring.  Otherwise normal radiographs right elbow.    PMFS History: Patient Active Problem List   Diagnosis Date Noted   Impingement syndrome of right shoulder 10/05/2022   Hot flashes 09/24/2020   Seasonal allergies 05/28/2020   Mixed hyperlipidemia 04/20/2020   Primary hypertension 10/31/2019   Tobacco abuse 10/31/2019   Past Medical History:  Diagnosis Date   Hypertension    Mixed hyperlipidemia  04/20/2020    Family History  Problem Relation Age of Onset   Diabetes Mother    Heart disease Mother    Hypertension Mother    Liver disease Father    Hypertension Sister    Asthma Daughter    Allergies Daughter    Hyperlipidemia Brother    Breast cancer Other     Past Surgical History:  Procedure Laterality Date   foot sx     TOTAL ABDOMINAL HYSTERECTOMY     Social History   Occupational History   Not on file  Tobacco Use   Smoking status: Every Day    Current packs/day: 0.25    Average packs/day: 0.3 packs/day for 41.2 years (10.3 ttl pk-yrs)    Types: Cigarettes    Start date: 07/21/1981   Smokeless tobacco: Never  Vaping Use   Vaping status: Never Used  Substance and Sexual Activity   Alcohol use: Yes    Comment: occ   Drug use: Never   Sexual activity: Not Currently

## 2022-10-09 ENCOUNTER — Telehealth: Payer: Self-pay

## 2022-10-09 NOTE — Telephone Encounter (Signed)
  Medicaid Managed Care   Unsuccessful Outreach Note  10/09/2022 Name: Anayla Giannetti MRN: [id number] DOB: [birth year]  Referred by: Arrie Senate, FNP Reason for referral : No chief complaint on file.   An unsuccessful telephone outreach was attempted today. The patient was referred to the case management team for assistance with care management and care coordination.   Follow Up Plan: If patient returns call to provider office, please advise to call Embedded Care Management Care Guide Nicholes Rough* at [phone]*  Nicholes Rough, CMA Care Guide [employer]

## 2022-12-06 ENCOUNTER — Telehealth: Payer: Self-pay | Admitting: Family Medicine

## 2022-12-06 ENCOUNTER — Other Ambulatory Visit: Payer: Self-pay | Admitting: Family Medicine

## 2022-12-06 DIAGNOSIS — I10 Essential (primary) hypertension: Secondary | ICD-10-CM

## 2022-12-06 DIAGNOSIS — E782 Mixed hyperlipidemia: Secondary | ICD-10-CM

## 2022-12-06 DIAGNOSIS — R232 Flushing: Secondary | ICD-10-CM

## 2022-12-06 MED ORDER — FLUOXETINE HCL 20 MG PO CAPS
20.0000 mg | ORAL_CAPSULE | Freq: Every day | ORAL | 0 refills | Status: DC
Start: 2022-12-06 — End: 2023-01-04

## 2022-12-06 MED ORDER — AMLODIPINE BESYLATE 5 MG PO TABS: 5.0000 mg | ORAL_TABLET | Freq: Every day | ORAL | 0 refills | Status: DC

## 2022-12-06 MED ORDER — PRAVASTATIN SODIUM 40 MG PO TABS: 40.0000 mg | ORAL_TABLET | Freq: Every evening | ORAL | 0 refills | Status: DC

## 2022-12-06 NOTE — Telephone Encounter (Signed)
Refills sent to pharmacy patient aware

## 2022-12-06 NOTE — Telephone Encounter (Signed)
Pt stated that she has her meds

## 2022-12-06 NOTE — Telephone Encounter (Signed)
Copied from CRM [id number]. Topic: Clinical - Medication Refill >> Dec 06, 2022  8:44 AM Golden Laura wrote: Most Recent Primary Care Visit:  Provider: Neale Burly CAPPS  Department: [facility]  Visit Type: OFFICE VISIT  Date: 09/06/2022  Medication: amLODipine (NORVASC) 5 MG tablet, pravastatin (PRAVACHOL) 40 MG tablet, FLUoxetine (PROZAC) 20 MG capsule  Has the patient contacted their pharmacy? Yes , stated that there was no refills on the medication ,and for patient to contact provider for the refills  (Agent: If no, request that the patient contact the pharmacy for the refill. If patient does not wish to contact the pharmacy document the reason why and proceed with request.) (Agent: If yes, when and what did the pharmacy advise?)  Is this the correct pharmacy for this prescription? Yes  If no, delete pharmacy and type the correct one.  This is the patient's preferred pharmacy:  [facility] [place] - [address] [place] [address] Phone: [phone] Fax: [phone]   Has the prescription been filled recently?   Is the patient out of the medication? Been out for about 6 months reason not able to afford any of her medication and have not been taking ,patient check her blood pressure on 12/05/22  blood pressure reading 162/90  Patient stated need her cholesterol medication as well but not able to afford it   Has the patient been seen for an appointment in the last year OR does the patient have an upcoming appointment? Yes   Can we respond through MyChart? No   Agent: Please be advised that Rx refills may take up to 3 business days. We ask that you follow-up with your pharmacy.

## 2022-12-06 NOTE — Addendum Note (Signed)
Addended by: Quay Burow on: 12/06/2022 02:36 PM   Modules accepted: Orders

## 2023-01-04 ENCOUNTER — Ambulatory Visit (INDEPENDENT_AMBULATORY_CARE_PROVIDER_SITE_OTHER): Payer: Medicaid Other | Admitting: Family Medicine

## 2023-01-04 ENCOUNTER — Encounter: Payer: Self-pay | Admitting: Family Medicine

## 2023-01-04 VITALS — BP 109/65 | HR 80 | Temp 98.5°F | Ht 59.0 in | Wt 102.0 lb

## 2023-01-04 DIAGNOSIS — J302 Other seasonal allergic rhinitis: Secondary | ICD-10-CM

## 2023-01-04 DIAGNOSIS — Z5986 Financial insecurity: Secondary | ICD-10-CM

## 2023-01-04 DIAGNOSIS — E559 Vitamin D deficiency, unspecified: Secondary | ICD-10-CM

## 2023-01-04 DIAGNOSIS — Z23 Encounter for immunization: Secondary | ICD-10-CM

## 2023-01-04 DIAGNOSIS — R232 Flushing: Secondary | ICD-10-CM | POA: Diagnosis not present

## 2023-01-04 DIAGNOSIS — Z0001 Encounter for general adult medical examination with abnormal findings: Secondary | ICD-10-CM

## 2023-01-04 DIAGNOSIS — F17200 Nicotine dependence, unspecified, uncomplicated: Secondary | ICD-10-CM | POA: Diagnosis not present

## 2023-01-04 DIAGNOSIS — E782 Mixed hyperlipidemia: Secondary | ICD-10-CM | POA: Diagnosis not present

## 2023-01-04 DIAGNOSIS — R7303 Prediabetes: Secondary | ICD-10-CM

## 2023-01-04 DIAGNOSIS — Z0000 Encounter for general adult medical examination without abnormal findings: Secondary | ICD-10-CM

## 2023-01-04 DIAGNOSIS — I10 Essential (primary) hypertension: Secondary | ICD-10-CM | POA: Diagnosis not present

## 2023-01-04 MED ORDER — CETIRIZINE HCL 10 MG PO TABS: 10.0000 mg | ORAL_TABLET | Freq: Every day | ORAL | 0 refills | Status: DC

## 2023-01-04 MED ORDER — PRAVASTATIN SODIUM 40 MG PO TABS: 40.0000 mg | ORAL_TABLET | Freq: Every evening | ORAL | 0 refills | Status: DC

## 2023-01-04 NOTE — Patient Instructions (Signed)
Health Maintenance, Female Adopting a healthy lifestyle and getting preventive care are important in promoting health and wellness. Ask your health care provider about: The right schedule for you to have regular tests and exams. Things you can do on your own to prevent diseases and keep yourself healthy. What should I know about diet, weight, and exercise? Eat a healthy diet  Eat a diet that includes plenty of vegetables, fruits, low-fat dairy products, and lean protein. Do not eat a lot of foods that are high in solid fats, added sugars, or sodium. Maintain a healthy weight Body mass index (BMI) is used to identify weight problems. It estimates body fat based on height and weight. Your health care provider can help determine your BMI and help you achieve or maintain a healthy weight. Get regular exercise Get regular exercise. This is one of the most important things you can do for your health. Most adults should: Exercise for at least 150 minutes each week. The exercise should increase your heart rate and make you sweat (moderate-intensity exercise). Do strengthening exercises at least twice a week. This is in addition to the moderate-intensity exercise. Spend less time sitting. Even light physical activity can be beneficial. Watch cholesterol and blood lipids Have your blood tested for lipids and cholesterol at 56 years of age, then have this test every 5 years. Have your cholesterol levels checked more often if: Your lipid or cholesterol levels are high. You are older than 56 years of age. You are at high risk for heart disease. What should I know about cancer screening? Depending on your health history and family history, you may need to have cancer screening at various ages. This may include screening for: Breast cancer. Cervical cancer. Colorectal cancer. Skin cancer. Lung cancer. What should I know about heart disease, diabetes, and high blood pressure? Blood pressure and heart  disease High blood pressure causes heart disease and increases the risk of stroke. This is more likely to develop in people who have high blood pressure readings or are overweight. Have your blood pressure checked: Every 3-5 years if you are 61-69 years of age. Every year if you are 48 years old or older. Diabetes Have regular diabetes screenings. This checks your fasting blood sugar level. Have the screening done: Once every three years after age 30 if you are at a normal weight and have a low risk for diabetes. More often and at a younger age if you are overweight or have a high risk for diabetes. What should I know about preventing infection? Hepatitis B If you have a higher risk for hepatitis B, you should be screened for this virus. Talk with your health care provider to find out if you are at risk for hepatitis B infection. Hepatitis C Testing is recommended for: Everyone born from [birth year] through 1964. Anyone with known risk factors for hepatitis C. Sexually transmitted infections (STIs) Get screened for STIs, including gonorrhea and chlamydia, if: You are sexually active and are younger than 56 years of age. You are older than 56 years of age and your health care provider tells you that you are at risk for this type of infection. Your sexual activity has changed since you were last screened, and you are at increased risk for chlamydia or gonorrhea. Ask your health care provider if you are at risk. Ask your health care provider about whether you are at high risk for HIV. Your health care provider may recommend a prescription medicine to help prevent HIV  infection. If you choose to take medicine to prevent HIV, you should first get tested for HIV. You should then be tested every 3 months for as long as you are taking the medicine. Pregnancy If you are about to stop having your period (premenopausal) and you may become pregnant, seek counseling before you get pregnant. Take 400 to 800  micrograms (mcg) of folic acid every day if you become pregnant. Ask for birth control (contraception) if you want to prevent pregnancy. Osteoporosis and menopause Osteoporosis is a disease in which the bones lose minerals and strength with aging. This can result in bone fractures. If you are 53 years old or older, or if you are at risk for osteoporosis and fractures, ask your health care provider if you should: Be screened for bone loss. Take a calcium or vitamin D supplement to lower your risk of fractures. Be given hormone replacement therapy (HRT) to treat symptoms of menopause. Follow these instructions at home: Alcohol use Do not drink alcohol if: Your health care provider tells you not to drink. You are pregnant, may be pregnant, or are planning to become pregnant. If you drink alcohol: Limit how much you have to: 0-1 drink a day. Know how much alcohol is in your drink. In the [place] one drink equals one 12 oz bottle of beer (355 mL), one 5 oz glass of wine (148 mL), or one 1 oz glass of hard liquor (44 mL). Lifestyle Do not use any products that contain nicotine or tobacco. These products include cigarettes, chewing tobacco, and vaping devices, such as e-cigarettes. If you need help quitting, ask your health care provider. Do not use street drugs. Do not share needles. Ask your health care provider for help if you need support or information about quitting drugs. General instructions Schedule regular health, dental, and eye exams. Stay current with your vaccines. Tell your health care provider if: You often feel depressed. You have ever been abused or do not feel safe at home. Summary Adopting a healthy lifestyle and getting preventive care are important in promoting health and wellness. Follow your health care provider's instructions about healthy diet, exercising, and getting tested or screened for diseases. Follow your health care provider's instructions on monitoring your  cholesterol and blood pressure. This information is not intended to replace advice given to you by your health care provider. Make sure you discuss any questions you have with your health care provider. Document Revised: 05/17/2020 Document Reviewed: 05/17/2020 [employer] Patient Education  2024 [employer]  Colonoscopy, Adult A colonoscopy is a procedure to look at the entire large intestine. This procedure is done using a long, thin, flexible tube that has a camera on the end. You may have a colonoscopy: As a part of normal colorectal screening. If you have certain symptoms, such as: A low number of red blood cells in your blood (anemia). Diarrhea that does not go away. Pain in your abdomen. Blood in your stool. A colonoscopy can help screen for and diagnose medical problems, including: An abnormal growth of cells or tissue (tumor). Abnormal growths within the lining of your intestine (polyps). Inflammation. Areas of bleeding. Tell your health care provider about: Any allergies you have. All medicines you are taking, including vitamins, herbs, eye drops, creams, and over-the-counter medicines. Any problems you or family members have had with anesthetic medicines. Any bleeding problems you have. Any surgeries you have had. Any medical conditions you have. Any problems you have had with having bowel movements. Whether you  are pregnant or may be pregnant. What are the risks? Generally, this is a safe procedure. However, problems may occur, including: Bleeding. Damage to your intestine. Allergic reactions to medicines given during the procedure. Infection. This is rare. What happens before the procedure? Eating and drinking restrictions Follow instructions from your health care provider about eating or drinking restrictions, which may include: A few days before the procedure: Follow a low-fiber diet. Avoid nuts, seeds, dried fruit, raw fruits, and vegetables. 1-3 days before  the procedure: Eat only gelatin dessert or ice pops. Drink only clear liquids, such as water, clear juice, clear broth or bouillon, black coffee or tea, or clear soft drinks or sports drinks. Avoid liquids that contain red or purple dye. The day of the procedure: Do not eat solid foods. You may continue to drink clear liquids until up to 2 hours before the procedure. Do not eat or drink anything starting 2 hours before the procedure, or within the time period that your health care provider recommends. Bowel prep If you were prescribed a bowel prep to take by mouth (orally) to clean out your colon: Take it as told by your health care provider. Starting the day before your procedure, you will need to drink a large amount of liquid medicine. The liquid will cause you to have many bowel movements of loose stool until your stool becomes almost clear or light green. If your skin or the opening between the buttocks (anus) gets irritated from diarrhea, you may relieve the irritation using: Wipes with medicine in them, such as adult wet wipes with aloe and vitamin E. A product to soothe skin, such as petroleum jelly. If you vomit while drinking the bowel prep: Take a break for up to 60 minutes. Begin the bowel prep again. Call your health care provider if you keep vomiting or you cannot take the bowel prep without vomiting. To clean out your colon, you may also be given: Laxative medicines. These help you have a bowel movement. Instructions for enema use. An enema is liquid medicine injected into your rectum. Medicines Ask your health care provider about: Changing or stopping your regular medicines or supplements. This is especially important if you are taking iron supplements, diabetes medicines, or blood thinners. Taking medicines such as aspirin and ibuprofen. These medicines can thin your blood. Do not take these medicines unless your health care provider tells you to take them. Taking  over-the-counter medicines, vitamins, herbs, and supplements. General instructions Ask your health care provider what steps will be taken to help prevent infection. These may include washing skin with a germ-killing soap. If you will be going home right after the procedure, plan to have a responsible adult: Take you home from the hospital or clinic. You will not be allowed to drive. Care for you for the time you are told. What happens during the procedure?  An IV will be inserted into one of your veins. You will be given a medicine to make you fall asleep (general anesthetic). You will lie on your side with your knees bent. A lubricant will be put on the tube. Then the tube will be: Inserted into your anus. Gently eased through all parts of your large intestine. Air will be sent into your colon to keep it open. This may cause some pressure or cramping. Images will be taken with the camera and will appear on a screen. A small tissue sample may be removed to be looked at under a microscope (biopsy). The tissue  may be sent to a lab for testing if any signs of problems are found. If small polyps are found, they may be removed and checked for cancer cells. When the procedure is finished, the tube will be removed. The procedure may vary among health care providers and hospitals. What happens after the procedure? Your blood pressure, heart rate, breathing rate, and blood oxygen level will be monitored until you leave the hospital or clinic. You may have a small amount of blood in your stool. You may pass gas and have mild cramping or bloating in your abdomen. This is caused by the air that was used to open your colon during the exam. If you were given a sedative during the procedure, it can affect you for several hours. Do not drive or operate machinery until your health care provider says that it is safe. It is up to you to get the results of your procedure. Ask your health care provider, or the  department that is doing the procedure, when your results will be ready. Summary A colonoscopy is a procedure to look at the entire large intestine. Follow instructions from your health care provider about eating and drinking before the procedure. If you were prescribed an oral bowel prep to clean out your colon, take it as told by your health care provider. During the colonoscopy, a flexible tube with a camera on its end is inserted into the anus and then passed into all parts of the large intestine. This information is not intended to replace advice given to you by your health care provider. Make sure you discuss any questions you have with your health care provider. Document Revised: 02/07/2022 Document Reviewed: 08/18/2020 [employer] Patient Education  2024 [employer].

## 2023-01-04 NOTE — Progress Notes (Signed)
Laura Golden is a 56 y.o. female presents to office today for annual physical exam examination.    Concerns today include: 1. Therapy in [facility] for right shoulder pain. States that she received two therapy shots and improved greatly. States that she cannot afford her medications. States that she cannot work since her fall.   Occupation: looking for work  Marital status: no concern for STDs  Diet: boiled eggs, TV dinners, spaghettis, sandwiches, limits her bread  Exercise: walking some, limited  Substance use: tobacco, 1 Pack every 5 days.  Started smoking at 57 years old.  Last eye exam: completed within the last 2 years  Last dental exam: UTD  Last colonoscopy: Cologuard 2023  Last mammogram: due 08/01/24 Last pap smear: total hysterectomy  Contraceptive: above  Refills needed today: yes, but cannot afford  Other specialists seen: none  Dermatology exam: none  Fasting today:  yes  Immunizations needed: Flu Vaccine: yes  Tdap Vaccine: not due  - every 53yrs - (<3 lifetime doses or unknown): all wounds -- look up need for Tetanus IG - (>=3 lifetime doses): clean/minor wound if >57yrs from previous; all other wounds if >70yrs from previous Zoster Vaccine: completed (those >50yo, once) Pneumonia Vaccine: not due (those w/ risk factors) - (<75yr) Both: Immunocompromised, cochlear implant, CSF leak, asplenic, sickle cell, Chronic Renal Failure - (<52yr) PPSV-23 only: Heart dz, lung disease, DM, tobacco abuse, alcoholism, cirrhosis/liver disease. - (>58yr): PPSV13 then PPSV23 in 6-12mths;  - (>76yr): repeat PPSV23 once if pt received prior to 57yo and 48yrs have passed   Past Medical History:  Diagnosis Date   Hypertension    Mixed hyperlipidemia 04/20/2020   Social History   Socioeconomic History   Marital status: Single    Spouse name: Not on file   Number of children: Not on file   Years of education: Not on file   Highest education level: Not on file  Occupational History    Not on file  Tobacco Use   Smoking status: Every Day    Current packs/day: 0.25    Average packs/day: 0.2 packs/day for 41.5 years (10.4 ttl pk-yrs)    Types: Cigarettes    Start date: 07/21/1981   Smokeless tobacco: Never  Vaping Use   Vaping status: Never Used  Substance and Sexual Activity   Alcohol use: Yes    Comment: occ   Drug use: Never   Sexual activity: Not Currently  Other Topics Concern   Not on file  Social History Narrative   Not on file   Social Drivers of Health   Financial Resource Strain: Not on file  Food Insecurity: No Food Insecurity (01/04/2023)   Hunger Vital Sign    Worried About Running Out of Food in the Last Year: Never true    Ran Out of Food in the Last Year: Never true  Transportation Needs: Not on file  Physical Activity: Not on file  Stress: Not on file  Social Connections: Not on file  Intimate Partner Violence: Not At Risk (01/04/2023)   Humiliation, Afraid, Rape, and Kick questionnaire    Fear of Current or Ex-Partner: No    Emotionally Abused: No    Physically Abused: No    Sexually Abused: No   Past Surgical History:  Procedure Laterality Date   foot sx     TOTAL ABDOMINAL HYSTERECTOMY     Family History  Problem Relation Age of Onset   Diabetes Mother    Heart disease Mother  Hypertension Mother    Liver disease Father    Hypertension Sister    Asthma Daughter    Allergies Daughter    Hyperlipidemia Brother    Breast cancer Other     Current Outpatient Medications:    acetaminophen (TYLENOL) 325 MG tablet, Take 325 mg by mouth every 6 (six) hours as needed for mild pain or headache., Disp: , Rfl:    amLODipine (NORVASC) 5 MG tablet, Take 1 tablet (5 mg total) by mouth daily., Disp: 90 tablet, Rfl: 0   cetirizine (ZYRTEC) 10 MG tablet, Take 1 tablet (10 mg total) by mouth daily., Disp: 90 tablet, Rfl: 0   fluticasone (FLONASE) 50 MCG/ACT nasal spray, Place 2 sprays into both nostrils daily., Disp: 16 g, Rfl: 6    triamcinolone cream (KENALOG) 0.1 %, Apply 1 application. topically 2 (two) times daily., Disp: 80 g, Rfl: 1   albuterol (VENTOLIN HFA) 108 (90 Base) MCG/ACT inhaler, Inhale 2 puffs into the lungs every 6 (six) hours as needed for wheezing or shortness of breath. (Patient not taking: Reported on 01/04/2023), Disp: 8 g, Rfl: 0   aspirin 325 MG tablet, Take 325 mg by mouth daily. (Patient not taking: Reported on 01/04/2023), Disp: , Rfl:    FLUoxetine (PROZAC) 20 MG capsule, Take 1 capsule (20 mg total) by mouth daily. (Patient not taking: Reported on 01/04/2023), Disp: 90 capsule, Rfl: 0   pravastatin (PRAVACHOL) 40 MG tablet, Take 1 tablet (40 mg total) by mouth every evening. (Patient not taking: Reported on 01/04/2023), Disp: 90 tablet, Rfl: 0  Allergies  Allergen Reactions   Lisinopril Swelling    Facial swelling    Penicillins      ROS: Review of Systems Review of Systems  All other systems reviewed and are negative.   Physical exam    01/04/2023    8:11 AM 10/05/2022   11:06 AM 09/06/2022   11:04 AM  Vitals with BMI  Height 4\' 11"  4\' 11"  4\' 11"   Weight 102 lbs 99 lbs 96 lbs  BMI 20.59 19.98 19.38  Systolic 109  105  Diastolic 65  72  Pulse 80  86    Physical Exam Constitutional:      General: She is awake. She is not in acute distress.    Appearance: Normal appearance. She is well-developed, well-groomed and normal weight. She is not ill-appearing, toxic-appearing or diaphoretic.     Interventions: She is not intubated. HENT:     Head:     Salivary Glands: Right salivary gland is not diffusely enlarged or tender. Left salivary gland is not diffusely enlarged or tender.     Right Ear: There is impacted cerumen.     Left Ear: No laceration, drainage, swelling or tenderness.  No middle ear effusion. There is no impacted cerumen. No foreign body. No mastoid tenderness. No PE tube. No hemotympanum. Tympanic membrane is not injected, scarred, perforated, erythematous, retracted  or bulging.     Nose: No nasal deformity, septal deviation, signs of injury, laceration, nasal tenderness, mucosal edema, congestion or rhinorrhea.     Right Sinus: No maxillary sinus tenderness or frontal sinus tenderness.     Left Sinus: No maxillary sinus tenderness or frontal sinus tenderness.     Mouth/Throat:     Lips: Pink. No lesions.     Dentition: Abnormal dentition.  Eyes:     General: Lids are normal.     Extraocular Movements:     Right eye: Normal extraocular motion.  Left eye: Normal extraocular motion.     Conjunctiva/sclera:     Right eye: Right conjunctiva is not injected. No chemosis, exudate or hemorrhage.    Left eye: Left conjunctiva is not injected. No chemosis, exudate or hemorrhage.    Pupils: Pupils are equal, round, and reactive to light. Pupils are equal.     Right eye: Pupil is round, reactive and not sluggish. No corneal abrasion.     Left eye: Pupil is round, reactive and not sluggish. No corneal abrasion.     Funduscopic exam:    Right eye: No hemorrhage or exudate. Red reflex present.        Left eye: No hemorrhage or exudate. Red reflex present. Neck:     Thyroid: No thyroid mass or thyromegaly.     Vascular: No carotid bruit.     Trachea: Trachea and phonation normal. No tracheal tenderness, tracheostomy or tracheal deviation.  Cardiovascular:     Rate and Rhythm: Normal rate and regular rhythm.     Pulses:          Radial pulses are 2+ on the right side and 2+ on the left side.     Heart sounds: Normal heart sounds.  Pulmonary:     Effort: Pulmonary effort is normal. No tachypnea, bradypnea, accessory muscle usage, prolonged expiration, respiratory distress or retractions. She is not intubated.     Breath sounds: Normal breath sounds. No stridor, decreased air movement or transmitted upper airway sounds. No decreased breath sounds, wheezing, rhonchi or rales.  Abdominal:     General: Abdomen is flat. Bowel sounds are normal. There is no  distension or abdominal bruit. There are no signs of injury.     Palpations: Abdomen is soft. There is no shifting dullness, fluid wave, hepatomegaly, splenomegaly, mass or pulsatile mass.     Tenderness: There is no abdominal tenderness. There is no right CVA tenderness, left CVA tenderness, guarding or rebound.     Hernia: No hernia is present.  Musculoskeletal:     Cervical back: Full passive range of motion without pain, normal range of motion and neck supple. No edema, erythema, rigidity, torticollis or crepitus. No pain with movement. Normal range of motion.     Right lower leg: No edema.     Left lower leg: No edema.  Lymphadenopathy:     Head:     Right side of head: No submental, submandibular, tonsillar, preauricular or posterior auricular adenopathy.     Left side of head: No submental, submandibular, tonsillar, preauricular or posterior auricular adenopathy.     Cervical: No cervical adenopathy.     Right cervical: No superficial, deep or posterior cervical adenopathy.    Left cervical: No superficial, deep or posterior cervical adenopathy.  Skin:    General: Skin is warm.     Capillary Refill: Capillary refill takes less than 2 seconds.  Neurological:     General: No focal deficit present.     Mental Status: She is alert, oriented to person, place, and time and easily aroused. Mental status is at baseline.     Cranial Nerves: Cranial nerves 2-12 are intact. No cranial nerve deficit or facial asymmetry.     Motor: Motor function is intact.     Gait: Gait is intact.  Psychiatric:        Attention and Perception: Attention and perception normal.        Mood and Affect: Mood and affect normal.        Speech:  Speech normal.        Behavior: Behavior normal. Behavior is cooperative.        Thought Content: Thought content normal.        Cognition and Memory: Cognition and memory normal.        Judgment: Judgment normal.       01/04/2023    8:21 AM 09/06/2022   11:07 AM  08/29/2022    9:39 AM 08/09/2022    8:46 AM 07/06/2022    8:07 AM  Depression screen PHQ 2/9  Decreased Interest 0 3 3 3 3   Down, Depressed, Hopeless 0 0 0 0 0  PHQ - 2 Score 0 3 3 3 3   Altered sleeping  0 0 0 0  Tired, decreased energy  0 0 0 0  Change in appetite  0 0 0 0  Feeling bad or failure about yourself   0 0 0 0  Trouble concentrating  0 0 0 0  Moving slowly or fidgety/restless  0 0 0 0  Suicidal thoughts  0 0 0 0  PHQ-9 Score  3 3 3 3   Difficult doing work/chores  Not difficult at all Not difficult at all  Not difficult at all      01/04/2023    8:21 AM 09/06/2022   11:07 AM 08/29/2022    9:42 AM 08/09/2022    8:46 AM  GAD 7 : Generalized Anxiety Score  Nervous, Anxious, on Edge 0 0 0 0  Control/stop worrying 0 0 0 0  Worry too much - different things 0 0 0 0  Trouble relaxing 0 0 0 0  Restless 0 0 0 0  Easily annoyed or irritable 0 0 0 0  Afraid - awful might happen 0 0 0 0  Total GAD 7 Score 0 0 0 0  Anxiety Difficulty Not difficult at all Not difficult at all Not difficult at all Not difficult at all   The 10-year ASCVD risk score (Arnett DK, et al., 2019) is: 3.8%   Values used to calculate the score:     Age: 7 years     Sex: Female     Is Non-Hispanic African American: Yes     Diabetic: No     Tobacco smoker: Yes     Systolic Blood Pressure: 109 mmHg     Is BP treated: Yes     HDL Cholesterol: 65 mg/dL     Total Cholesterol: 141 mg/dL  Assessment/ Plan: Najmo Klemmer here for annual physical exam.  1. Routine general medical examination at a health care facility (Primary) Discussed with patient to continue healthy lifestyle choices, including diet (rich in fruits, vegetables, and lean proteins, and low in salt and simple carbohydrates) and exercise (at least 30 minutes of moderate physical activity daily). Limit beverages high is sugar. Recommended at least 80-100 oz of water daily.   2. Primary hypertension Well controlled. Continue current regimen.  Labs as below. Will communicate results to patient once available. Will await results to determine next steps.  - CMP14+EGFR  3. Hot flashes Not at goal. Patient to stop prozac. Do not wish to start new medication at this time due to patient inability to afford medications. Concerned that patient will not be able to take medications as prescribed and adverse effects will arise due to SSRI/SNRIs.   4. Mixed hyperlipidemia Refill provided. Well controlled at last labs. Reviewed ASCVD risk score.  - pravastatin (PRAVACHOL) 40 MG tablet; Take 1 tablet (40 mg total)  by mouth every evening.  Dispense: 90 tablet; Refill: 0  5. Vitamin D deficiency Labs as below. Will communicate results to patient once available. Will await results to determine next steps.  - VITAMIN D 25 Hydroxy (Vit-D Deficiency, Fractures) - CMP14+EGFR  6. Prediabetes  Labs as below. Will communicate results to patient once available. Will await results to determine next steps.  Well controlled at last labs.  - Bayer DCA Hb A1c Waived - CMP14+EGFR  7. Tobacco use disorder Encouraged patient to continue to try to quit. Patient previously referred for lung cancer screening. She has been in contact with [facility] and is planning to follow up.  - CMP14+EGFR  8. Seasonal allergies Well controlled. Refill provided.  - cetirizine (ZYRTEC) 10 MG tablet; Take 1 tablet (10 mg total) by mouth daily.  Dispense: 90 tablet; Refill: 0  9. Financial insecurity Referral placed as below as patient is unable to afford her medications, transportation, cell phone.  - AMB Referral [facility] Care Management  10. Encounter for immunization - Flu vaccine trivalent PF, 6mos and older(Flulaval,Afluria,Fluarix,Fluzone)   Patient to follow up in 1 year for annual exam or sooner if needed.  The above assessment and management plan was discussed with the patient. The patient verbalized understanding of and has agreed to the management plan.  Patient is aware to call the clinic if symptoms persist or worsen. Patient is aware when to return to the clinic for a follow-up visit. Patient educated on when it is appropriate to go to the emergency department.   Neale Burly, DNP-FNP [facility] [address] [place], [place] [address] [phone]

## 2023-01-05 ENCOUNTER — Telehealth: Payer: Self-pay

## 2023-01-05 ENCOUNTER — Encounter: Payer: Medicaid Other | Admitting: Family Medicine

## 2023-01-05 NOTE — Telephone Encounter (Signed)
Copied from CRM [id number]. Topic: Clinical - Medication Question >> Jan 05, 2023  9:51 AM [name] wrote: Reason for CRM: Patient called in stating she had an appointment on yesterday with NP Milian, and was told she would call her a medication in for hot flashes she doesn't know what the medication is called but was told it would be called it.  She has checked the pharmacy this morning, and the pharmacy stated they have no record of a prescription being called in today.

## 2023-01-05 NOTE — Telephone Encounter (Signed)
Please advise 

## 2023-01-17 ENCOUNTER — Telehealth: Payer: Self-pay

## 2023-01-17 ENCOUNTER — Encounter: Payer: Self-pay | Admitting: Podiatry

## 2023-01-17 NOTE — Progress Notes (Signed)
 Care Guide Pharmacy Note  01/17/2023 Name: Laura Golden MRN: [id number] DOB: June 24, 1965  Referred By: Cathlene Marry Lenis, FNP Reason for referral: Care Coordination (Outreach to schedule with Pharm d )   Laura Golden is a 58 y.o. year old female who is a primary care patient of Cathlene Marry Lenis, FNP.  Laura Golden was referred to the pharmacist for assistance related to: HTN and HLD  An unsuccessful telephone outreach was attempted today to contact the patient who was referred to the pharmacy team for assistance with medication assistance. Additional attempts will be made to contact the patient.  Jeoffrey Buffalo , RMA     [facility]  [facility], [facility] Guide  Direct Dial: [phone]  Website: [email or web address]

## 2023-01-25 NOTE — Progress Notes (Signed)
Care Guide Pharmacy Note  01/25/2023 Name: Shely Winthrop MRN: [id number] DOB: 1965-11-05  Referred By: Arrie Senate, FNP Reason for referral: Care Coordination (Outreach to schedule with Pharm d )   Corryn Lotspeich is a 58 y.o. year old female who is a primary care patient of Arrie Senate, FNP.  Ceana Basaldua was referred to the pharmacist for assistance related to: HTN and HLD  A second unsuccessful telephone outreach was attempted today to contact the patient who was referred to the pharmacy team for assistance with medication assistance. Additional attempts will be made to contact the patient.  Penne Lash , RMA     [facility]  [facility], [facility] Guide  Direct Dial: [phone]  Website: [email or web address]

## 2023-01-26 ENCOUNTER — Ambulatory Visit: Payer: Medicaid Other | Admitting: Podiatry

## 2023-01-30 NOTE — Progress Notes (Signed)
Care Guide Pharmacy Note  01/30/2023 Name: Laura Golden MRN: [id number] DOB: 15-Jul-1965  Referred By: Arrie Senate, FNP Reason for referral: Care Coordination (Outreach to schedule with Pharm d )   Laura Golden is a 58 y.o. year old female who is a primary care patient of Arrie Senate, FNP.  Laura Golden was referred to the pharmacist for assistance related to: HTN and HLD  A third unsuccessful telephone outreach was attempted today to contact the patient who was referred to the pharmacy team for assistance with medication assistance. The Population Health team is pleased to engage with this patient at any time in the future upon receipt of referral and should he/she be interested in assistance from the [facility] team.  Laura Golden , RMA     [facility]  [facility], [facility] Guide  Direct Dial: [phone]  Website: [email or web address]

## 2023-02-09 ENCOUNTER — Ambulatory Visit (INDEPENDENT_AMBULATORY_CARE_PROVIDER_SITE_OTHER): Payer: Medicaid Other | Admitting: Podiatry

## 2023-02-09 DIAGNOSIS — M7671 Peroneal tendinitis, right leg: Secondary | ICD-10-CM

## 2023-02-09 DIAGNOSIS — B351 Tinea unguium: Secondary | ICD-10-CM

## 2023-02-09 DIAGNOSIS — L84 Corns and callosities: Secondary | ICD-10-CM

## 2023-02-09 MED ORDER — METHYLPREDNISOLONE 4 MG PO TBPK: ORAL_TABLET | ORAL | 0 refills | Status: DC

## 2023-02-09 NOTE — Patient Instructions (Signed)
Peroneal Tendinopathy Rehab Ask your health care provider which exercises are safe for you. Do exercises exactly as told by your health care provider and adjust them as directed. It is normal to feel mild stretching, pulling, tightness, or discomfort as you do these exercises. Stop right away if you feel sudden pain or your pain gets worse. Do not begin these exercises until told by your health care provider. Stretching and range-of-motion exercises These exercises warm up your muscles and joints. They can help improve the movement and flexibility of your ankle. They may also help to relieve pain and stiffness. Gastrocnemius and soleus stretch, standing This is an exercise in which you stand on a step and use your body weight to stretch your calf muscles. To do this exercise: Stand on the edge of a step on the ball of your left / right foot. The ball of your foot is on the walking surface, right under your toes. Keep your other foot firmly on the same step. Hold on to the wall, a railing, or a chair for balance. Slowly lift your other foot, allowing your body weight to press your left / right heel down over the edge of the step. You should feel a stretch in your left / right calf (gastrocnemius and soleus). Hold this position for __________ seconds. Return both feet to the step. Repeat this exercise with a slight bend in your left / right knee. Repeat __________ times with your left / right knee straight and __________ times with your left / right knee bent. Complete this exercise __________ times a day. Strengthening exercises These exercises build strength and endurance in your foot and ankle. Endurance is the ability to use your muscles for a long time, even after they get tired. Ankle dorsiflexion with band  Secure a rubber exercise band or tube to an object, such as a table leg, that will not move when the band is pulled. Secure the other end of the band around your left / right foot. Sit on  the floor. Face the object with your left / right leg extended. The band or tube should be slightly tense when your foot is relaxed. Slowly flex your left / right ankle and toes to bring your foot toward you (dorsiflexion). Hold this position for __________ seconds. Let the band or tube slowly pull your foot back to the starting position. Repeat __________ times. Complete this exercise __________ times a day. Ankle eversion  Sit on the floor with your legs straight out in front of you. Loop a rubber exercise band or tube around the ball of your left / right foot. The ball of your foot is on the walking surface, right under your toes. Hold the ends of the band in your hands. You can also secure the band to a stable object. The band or tube should be slightly tense when your foot is relaxed. Slowly push your foot outward, away from your other leg (eversion). Hold this position for __________ seconds. Slowly return your foot to the starting position. Repeat __________ times. Complete this exercise __________ times a day. Plantar flexion, standing This exercise is sometimes called a standing heel raise. Stand with your feet shoulder-width apart. Place your hands on a wall or table to steady yourself as needed. Try not to use it for support. Keep your weight spread evenly over the width of your feet while you slowly rise up on your toes (plantar flexion). If told by your health care provider: Shift your weight   toward your left / right leg until you feel challenged. Stand on your left / right leg only. Hold this position for __________ seconds. Repeat __________ times. Complete this exercise __________ times a day. Single leg stand  Without shoes, stand near a railing or in a doorway. You may hold on to the railing or doorframe as needed. Stand on your left / right foot. Keep your big toe down on the floor and try to keep your arch lifted. Do not roll to the outside of your foot. If this  exercise is too easy, you can try it with your eyes closed or while standing on a pillow. Hold this position for __________ seconds. Repeat __________ times. Complete this exercise __________ times a day. This information is not intended to replace advice given to you by your health care provider. Make sure you discuss any questions you have with your health care provider. Document Revised: 04/21/2021 Document Reviewed: 04/21/2021 Elsevier Patient Education  2024 Elsevier Inc.  

## 2023-02-09 NOTE — Progress Notes (Signed)
  Subjective:  Patient ID: Laura Golden, female    DOB: 28-Oct-1965,   MRN: [id number]  No chief complaint on file.   58 y.o. female presents for routine nail care and callus debridement. Relates she has not had a trimming in in a while and is due.  Relates the areas have been very painful. Denies any other pedal complaints. Denies n/v/f/c.   Relates New problem for concern of right ankle pain that has been ongoing for a couple months. Relates pain with walking. Denies any injury. Has tried some anti-inflammatories but not been helpful   Past Medical History:  Diagnosis Date   Hypertension    Mixed hyperlipidemia 04/20/2020    Objective:  Physical Exam: Vascular: DP/PT pulses 2/4 bilateral. CFT <3 seconds. Normal hair growth on digits. No edema.  Skin. No lacerations or abrasions bilateral feet. Scaling lesion noted bilateral plantar feet. Hyperkeratosis noted bilateral 5th metatarsal heads and medial hallux bilateral. Nails 1-5 are thickened discolored and elongated with subungual debris.  Musculoskeletal: MMT 5/5 bilateral lower extremities in DF, PF, Inversion and Eversion. Deceased ROM in DF of ankle joint.  Neurological: Sensation intact to light touch.   Assessment:   1. Dermatophytosis of nail         Plan:  Patient was evaluated and treated and all questions answered. -Mechanically debrided all nails 1-5 bilateral using sterile nail nipper and filed with dremel without incident  -Hyperkeratotic lesions x 3 were debrided without incident with chisel.  -Applied salycylic acid treatment to area with dressing. Advised to remove bandaging tomorrow.  -Answered all patient questions -Patient to return as needed for routine foot care.  Discussed peroneal tendinitis and treatment options at length with patient Discussed stretching exercises and provided handout. Prescription for medrol dose pack provided.  Dispensed Tri-Lock ankle brace. Discussed that if the symptoms do not  improve can consider PT/MRI. Patient to return in 6 to 8 weeks or sooner if symptoms fail to improve or worsen.    Louann Sjogren, DPM

## 2023-03-05 ENCOUNTER — Other Ambulatory Visit: Payer: Self-pay | Admitting: Family Medicine

## 2023-03-05 DIAGNOSIS — I10 Essential (primary) hypertension: Secondary | ICD-10-CM

## 2023-03-23 ENCOUNTER — Encounter: Payer: Self-pay | Admitting: Podiatry

## 2023-03-23 ENCOUNTER — Ambulatory Visit (INDEPENDENT_AMBULATORY_CARE_PROVIDER_SITE_OTHER): Payer: Medicaid Other | Admitting: Podiatry

## 2023-03-23 DIAGNOSIS — L84 Corns and callosities: Secondary | ICD-10-CM

## 2023-03-23 DIAGNOSIS — M79676 Pain in unspecified toe(s): Secondary | ICD-10-CM

## 2023-03-23 DIAGNOSIS — B351 Tinea unguium: Secondary | ICD-10-CM

## 2023-03-23 NOTE — Progress Notes (Signed)
  Subjective:  Patient ID: Laura Golden, female    DOB: Oct 23, 1965,   MRN: [id number]  No chief complaint on file.   58 y.o. female presents for routine nail care and callus debridement. Relates she has not had a trimming in in a while and is due.  Relates the areas have been very painful. Denies any other pedal complaints. Denies n/v/f/c.   Relates ankle and right foot area doing much better and stopped wearing the brace.   Past Medical History:  Diagnosis Date   Hypertension    Mixed hyperlipidemia 04/20/2020    Objective:  Physical Exam: Vascular: DP/PT pulses 2/4 bilateral. CFT <3 seconds. Normal hair growth on digits. No edema.  Skin. No lacerations or abrasions bilateral feet. Scaling lesion noted bilateral plantar feet. Hyperkeratosis noted bilateral 5th metatarsal heads and medial hallux bilateral. Nails 1-5 are thickened discolored and elongated with subungual debris.  Musculoskeletal: MMT 5/5 bilateral lower extremities in DF, PF, Inversion and Eversion. Deceased ROM in DF of ankle joint.  Neurological: Sensation intact to light touch.   Assessment:   1. Dermatophytosis of nail   2. Callus of foot         Plan:  Patient was evaluated and treated and all questions answered. -Mechanically debrided all nails 1-5 bilateral using sterile nail nipper and filed with dremel without incident  -Hyperkeratotic lesions x 3 were debrided without incident with chisel.  -Applied salycylic acid treatment to area with dressing. Advised to remove bandaging tomorrow.  -Answered all patient questions -Patient to return as needed for routine foot care.     Louann Sjogren, DPM

## 2023-04-04 ENCOUNTER — Encounter: Payer: Self-pay | Admitting: Family Medicine

## 2023-04-04 ENCOUNTER — Ambulatory Visit (INDEPENDENT_AMBULATORY_CARE_PROVIDER_SITE_OTHER): Payer: Medicaid Other | Admitting: Family Medicine

## 2023-04-04 VITALS — BP 129/80 | HR 78 | Temp 98.3°F | Ht 59.0 in | Wt 103.0 lb

## 2023-04-04 DIAGNOSIS — I10 Essential (primary) hypertension: Secondary | ICD-10-CM

## 2023-04-04 DIAGNOSIS — M7541 Impingement syndrome of right shoulder: Secondary | ICD-10-CM

## 2023-04-04 DIAGNOSIS — R7303 Prediabetes: Secondary | ICD-10-CM | POA: Diagnosis not present

## 2023-04-04 DIAGNOSIS — E559 Vitamin D deficiency, unspecified: Secondary | ICD-10-CM | POA: Diagnosis not present

## 2023-04-04 DIAGNOSIS — Z5986 Financial insecurity: Secondary | ICD-10-CM | POA: Diagnosis not present

## 2023-04-04 DIAGNOSIS — F17200 Nicotine dependence, unspecified, uncomplicated: Secondary | ICD-10-CM | POA: Diagnosis not present

## 2023-04-04 DIAGNOSIS — J302 Other seasonal allergic rhinitis: Secondary | ICD-10-CM | POA: Diagnosis not present

## 2023-04-04 DIAGNOSIS — E782 Mixed hyperlipidemia: Secondary | ICD-10-CM | POA: Diagnosis not present

## 2023-04-04 DIAGNOSIS — Z720 Tobacco use: Secondary | ICD-10-CM | POA: Diagnosis not present

## 2023-04-04 LAB — BAYER DCA HB A1C WAIVED: HB A1C (BAYER DCA - WAIVED): 5.5 % (ref 4.8–5.6)

## 2023-04-04 MED ORDER — NAPROXEN 500 MG PO TABS: 500.0000 mg | ORAL_TABLET | Freq: Two times a day (BID) | ORAL | 0 refills | Status: DC

## 2023-04-04 MED ORDER — PRAVASTATIN SODIUM 40 MG PO TABS: 40.0000 mg | ORAL_TABLET | Freq: Every evening | ORAL | 1 refills | Status: DC

## 2023-04-04 MED ORDER — AMLODIPINE BESYLATE 5 MG PO TABS: 5.0000 mg | ORAL_TABLET | Freq: Every day | ORAL | 1 refills | Status: DC

## 2023-04-04 NOTE — Progress Notes (Signed)
 Subjective:  Patient ID: Laura Golden, female    DOB: Mar 12, 1965, 58 y.o.   MRN: [id number]  Patient Care Team: Arrie Senate, FNP as PCP - General (Family Medicine)   Chief Complaint:  Medical Management of Chronic Issues (3 month follow up/Right shoulder pain)  HPI: Laura Golden is a 58 y.o. female presenting on 04/04/2023 for Medical Management of Chronic Issues (3 month follow up/Right shoulder pain)  HPI 1. Primary hypertension Has BP monitor at home Yes BP at home average 130s SBP. States that she has a new one and is not sure if it is accurate.  ROS Denies anxiety, fatigue, peripheral edema, changes to vision, chest pain, headaches, palpitations, sweats Endorses SOB, PND, orthopnea Meds amlodipine CAD risks Diabetes Mellitus, hypertension, hypercholesterolemia/hyperlipidemia  2. Mixed hyperlipidemia Lipid/Cholesterol, Follow-up  Last lipid panel Other pertinent labs  Lab Results  Component Value Date   CHOL 141 07/06/2022   HDL 65 07/06/2022   LDLCALC 60 07/06/2022   TRIG 82 07/06/2022   CHOLHDL 2.2 07/06/2022   Lab Results  Component Value Date   ALT 18 07/06/2022   AST 20 07/06/2022   PLT 246 08/29/2022   TSH 4.460 08/29/2022     She was last seen for this 3 months ago.  Management since that visit includes pravastatin .  She reports excellent compliance with treatment. She is not having side effects.   Symptoms: No chest pain No chest pressure/discomfort  Yes dyspnea No lower extremity edema  No numbness or tingling of extremity Yes orthopnea  No palpitations Yes paroxysmal nocturnal dyspnea  No speech difficulty No syncope    The 10-year ASCVD risk score (Arnett DK, et al., 2019) is: 3.8%  --------------------------------------------------------------------------------------------------- 3. Tobacco abuse Quit smoking last month  She is going to lung cancer screening. Would like it moved to [place].   4. Seasonal allergies Reports  that pollen is triggering. Reports that she is taking albuterol and flonase. She is not taking claritin at this time due to finances. Reports eyes watering sneezing. Reports that she gets more out of breath in the monrings and if she is walking. States tha tshe is waking up short of breath.   5. Prediabetes  States that she is still walking often and she is monitoring what she is eating.   6. Vitamin D   She is not able to afford supplementation at this time. States that she cannot work due to her arm pain, so she is not making any money.   7. Right Shoulder pain  States that it is not improving. Worsening pain with cold weather. She is taking tylenol pain reliever, taking one as needed. She is not taking ibuprofen. She states that it cracks and pops.  States that she is not able to work at this time due to her arm.    Relevant past medical, surgical, family, and social history reviewed and updated as indicated.  Allergies and medications reviewed and updated. Data reviewed: Chart in Epic.   Past Medical History:  Diagnosis Date   Hypertension    Mixed hyperlipidemia 04/20/2020    Past Surgical History:  Procedure Laterality Date   foot sx     TOTAL ABDOMINAL HYSTERECTOMY      Social History   Socioeconomic History   Marital status: Single    Spouse name: Not on file   Number of children: Not on file   Years of education: Not on file   Highest education level: Not on file  Occupational History   Not on file  Tobacco Use   Smoking status: Every Day    Current packs/day: 0.25    Average packs/day: 0.3 packs/day for 41.7 years (10.4 ttl pk-yrs)    Types: Cigarettes    Start date: 07/21/1981   Smokeless tobacco: Never  Vaping Use   Vaping status: Never Used  Substance and Sexual Activity   Alcohol use: Yes    Comment: occ   Drug use: Never   Sexual activity: Not Currently  Other Topics Concern   Not on file  Social History Narrative   Not on file   Social Drivers  of Health   Financial Resource Strain: Not on file  Food Insecurity: No Food Insecurity (01/04/2023)   Hunger Vital Sign    Worried About Running Out of Food in the Last Year: Never true    Ran Out of Food in the Last Year: Never true  Transportation Needs: Not on file  Physical Activity: Not on file  Stress: Not on file  Social Connections: Not on file  Intimate Partner Violence: Not At Risk (01/04/2023)   Humiliation, Afraid, Rape, and Kick questionnaire    Fear of Current or Ex-Partner: No    Emotionally Abused: No    Physically Abused: No    Sexually Abused: No    Outpatient Encounter Medications as of 04/04/2023  Medication Sig   acetaminophen (TYLENOL) 325 MG tablet Take 325 mg by mouth every 6 (six) hours as needed for mild pain or headache.   albuterol (VENTOLIN HFA) 108 (90 Base) MCG/ACT inhaler Inhale 2 puffs into the lungs every 6 (six) hours as needed for wheezing or shortness of breath. (Patient not taking: Reported on 01/04/2023)   amLODipine (NORVASC) 5 MG tablet TAKE ONE (1) TABLET BY MOUTH EVERY DAY   aspirin 325 MG tablet Take 325 mg by mouth daily. (Patient not taking: Reported on 01/04/2023)   cetirizine (ZYRTEC) 10 MG tablet Take 1 tablet (10 mg total) by mouth daily.   fluticasone (FLONASE) 50 MCG/ACT nasal spray Place 2 sprays into both nostrils daily.   methylPREDNISolone (MEDROL DOSEPAK) 4 MG TBPK tablet Take as directed   pravastatin (PRAVACHOL) 40 MG tablet Take 1 tablet (40 mg total) by mouth every evening.   triamcinolone cream (KENALOG) 0.1 % Apply 1 application. topically 2 (two) times daily.   No facility-administered encounter medications on file as of 04/04/2023.    Allergies  Allergen Reactions   Lisinopril Swelling    Facial swelling    Penicillins     Review of Systems As per HPI  Objective:  BP 129/80   Pulse 78   Temp 98.3 F (36.8 C)   Ht 4\' 11"  (1.499 m)   Wt 103 lb (46.7 kg)   SpO2 96%   BMI 20.80 kg/m    Wt Readings from  Last 3 Encounters:  01/04/23 102 lb (46.3 kg)  10/05/22 99 lb (44.9 kg)  09/06/22 96 lb (43.5 kg)   Physical Exam Constitutional:      General: She is awake. She is not in acute distress.    Appearance: Normal appearance. She is well-developed and well-groomed. She is not ill-appearing, toxic-appearing or diaphoretic.  Cardiovascular:     Rate and Rhythm: Normal rate and regular rhythm.     Pulses: Normal pulses.          Radial pulses are 2+ on the right side and 2+ on the left side.       Posterior tibial  pulses are 2+ on the right side and 2+ on the left side.     Heart sounds: Normal heart sounds. No murmur heard.    No gallop.  Pulmonary:     Effort: Pulmonary effort is normal. No respiratory distress.     Breath sounds: Normal breath sounds. No stridor. No wheezing, rhonchi or rales.  Musculoskeletal:     Cervical back: Full passive range of motion without pain and neck supple.     Right lower leg: No edema.     Left lower leg: No edema.  Skin:    General: Skin is warm.     Capillary Refill: Capillary refill takes less than 2 seconds.  Neurological:     General: No focal deficit present.     Mental Status: She is alert, oriented to person, place, and time and easily aroused. Mental status is at baseline.     GCS: GCS eye subscore is 4. GCS verbal subscore is 5. GCS motor subscore is 6.     Motor: No weakness.  Psychiatric:        Attention and Perception: Attention and perception normal.        Mood and Affect: Mood and affect normal.        Speech: Speech normal.        Behavior: Behavior normal. Behavior is cooperative.        Thought Content: Thought content normal. Thought content does not include homicidal or suicidal ideation. Thought content does not include homicidal or suicidal plan.        Cognition and Memory: Cognition and memory normal.        Judgment: Judgment normal.     Results for orders placed or performed in visit on 08/29/22  HepB+HepC+HIV Panel    Collection Time: 08/29/22 10:12 AM  Result Value Ref Range   Hepatitis B Surface Ag Negative Negative   Hep B E Ag Negative Negative   Hep B C IgM Negative Negative   Hep B Core Total Ab Negative Negative   Hep B E Ab Non Reactive Negative   Hep B Surface Ab, Qual Non Reactive    Hep C Virus Ab Non Reactive Non Reactive   HIV Screen 4th Generation wRfx Non Reactive Non Reactive  Sedimentation Rate   Collection Time: 08/29/22 10:12 AM  Result Value Ref Range   Sed Rate 2 0 - 40 mm/hr  C-reactive protein   Collection Time: 08/29/22 10:12 AM  Result Value Ref Range   CRP <1 0 - 10 mg/L  TSH   Collection Time: 08/29/22 10:12 AM  Result Value Ref Range   TSH 4.460 0.450 - 4.500 uIU/mL  CBC with Differential/Platelet   Collection Time: 08/29/22 10:12 AM  Result Value Ref Range   WBC 5.7 3.4 - 10.8 x10E3/uL   RBC 4.98 3.77 - 5.28 x10E6/uL   Hemoglobin 15.3 11.1 - 15.9 g/dL   Hematocrit 16.1 (H) 09.6 - 46.6 %   MCV 95 79 - 97 fL   MCH 30.7 26.6 - 33.0 pg   MCHC 32.3 31.5 - 35.7 g/dL   RDW 04.5 40.9 - 81.1 %   Platelets 246 150 - 450 x10E3/uL   Neutrophils 35 Not Estab. %   Lymphs 53 Not Estab. %   Monocytes 7 Not Estab. %   Eos 4 Not Estab. %   Basos 1 Not Estab. %   Neutrophils Absolute 2.0 1.4 - 7.0 x10E3/uL   Lymphocytes Absolute 3.0 0.7 - 3.1 x10E3/uL  Monocytes Absolute 0.4 0.1 - 0.9 x10E3/uL   EOS (ABSOLUTE) 0.2 0.0 - 0.4 x10E3/uL   Basophils Absolute 0.1 0.0 - 0.2 x10E3/uL   Immature Granulocytes 0 Not Estab. %   Immature Grans (Abs) 0.0 0.0 - 0.1 x10E3/uL   Hematology Comments: Note:        04/04/2023    8:24 AM 01/04/2023    8:21 AM 09/06/2022   11:07 AM 08/29/2022    9:39 AM 08/09/2022    8:46 AM  Depression screen PHQ 2/9  Decreased Interest 0 0 3 3 3   Down, Depressed, Hopeless 0 0 0 0 0  PHQ - 2 Score 0 0 3 3 3   Altered sleeping 0  0 0 0  Tired, decreased energy 0  0 0 0  Change in appetite 0  0 0 0  Feeling bad or failure about yourself  0  0 0 0   Trouble concentrating 0  0 0 0  Moving slowly or fidgety/restless 0  0 0 0  Suicidal thoughts 0  0 0 0  PHQ-9 Score 0  3 3 3   Difficult doing work/chores Not difficult at all  Not difficult at all Not difficult at all        04/04/2023    8:24 AM 01/04/2023    8:21 AM 09/06/2022   11:07 AM 08/29/2022    9:42 AM  GAD 7 : Generalized Anxiety Score  Nervous, Anxious, on Edge 0 0 0 0  Control/stop worrying 0 0 0 0  Worry too much - different things 0 0 0 0  Trouble relaxing 0 0 0 0  Restless 0 0 0 0  Easily annoyed or irritable 0 0 0 0  Afraid - awful might happen 0 0 0 0  Total GAD 7 Score 0 0 0 0  Anxiety Difficulty Not difficult at all Not difficult at all Not difficult at all Not difficult at all   Pertinent labs & imaging results that were available during my care of the patient were reviewed by me and considered in my medical decision making.  Assessment & Plan:  Maygan was seen today for medical management of chronic issues.  Diagnoses and all orders for this visit:  1. Primary hypertension (Primary) Well controlled. Continue current regimen. Refill provided. - CBC with Differential/Platelet - amLODipine (NORVASC) 5 MG tablet; Take 1 tablet (5 mg total) by mouth daily.  Dispense: 90 tablet; Refill: 1  2. Mixed hyperlipidemia Refill provided. Previously ordered lipids  - CBC with Differential/Platelet - pravastatin (PRAVACHOL) 40 MG tablet; Take 1 tablet (40 mg total) by mouth every evening.  Dispense: 90 tablet; Refill: 1  3. Tobacco abuse Praised patient on her desire to quit and her success for the last month. Encouraged patient to continue cessation.   4. Seasonal allergies Not at goal. Patient to pick up zyrtec when she is able.   5. Prediabetes Labs previously ordered. Well controlled at last evaluation.   6. Vitamin D deficiency Labs previously placed. Patient to start supplement when she is able   7. Impingement syndrome of right shoulder Reviewed notes  from Ophelia Charter, MD. 10/05/22. Will send in medication as below for symptom management. Recommend patient follow up with specialty.  - naproxen (NAPROSYN) 500 MG tablet; Take 1 tablet (500 mg total) by mouth 2 (two) times daily with a meal.  Dispense: 30 tablet; Refill: 0  8. Financial insecurity Patient previously referred to [facility] for financial insecurity. She states that she has a new  phone number. Will replace referral.   - AMB Referral [facility] Care Management  Continue all other maintenance medications.  Follow up plan: Return in about 3 months (around 07/05/2023).  Continue healthy lifestyle choices, including diet (rich in fruits, vegetables, and lean proteins, and low in salt and simple carbohydrates) and exercise (at least 30 minutes of moderate physical activity daily).  Written and verbal instructions provided   The above assessment and management plan was discussed with the patient. The patient verbalized understanding of and has agreed to the management plan. Patient is aware to call the clinic if they develop any new symptoms or if symptoms persist or worsen. Patient is aware when to return to the clinic for a follow-up visit. Patient educated on when it is appropriate to go to the emergency department.   Neale Burly, DNP-FNP [facility] [address] [place], [place] [address] [phone]

## 2023-04-05 LAB — CMP14+EGFR
ALT: 23 IU/L (ref 0–32)
AST: 30 IU/L (ref 0–40)
Albumin: 4.8 g/dL (ref 3.8–4.9)
Alkaline Phosphatase: 83 IU/L (ref 44–121)
BUN/Creatinine Ratio: 12 (ref 9–23)
BUN: 11 mg/dL (ref 6–24)
Bilirubin Total: 0.5 mg/dL (ref 0.0–1.2)
CO2: 23 mmol/L (ref 20–29)
Calcium: 9.6 mg/dL (ref 8.7–10.2)
Chloride: 104 mmol/L (ref 96–106)
Creatinine, Ser: 0.91 mg/dL (ref 0.57–1.00)
Globulin, Total: 2.1 g/dL (ref 1.5–4.5)
Glucose: 79 mg/dL (ref 70–99)
Potassium: 4.4 mmol/L (ref 3.5–5.2)
Sodium: 144 mmol/L (ref 134–144)
Total Protein: 6.9 g/dL (ref 6.0–8.5)
eGFR: 74 mL/min/{1.73_m2} (ref >59)

## 2023-04-05 LAB — CBC WITH DIFFERENTIAL/PLATELET
Basophils Absolute: 0.1 10*3/uL (ref 0.0–0.2)
Basos: 1 %
EOS (ABSOLUTE): 0.2 10*3/uL (ref 0.0–0.4)
Eos: 3 %
Hematocrit: 49.1 % — ABNORMAL HIGH (ref 34.0–46.6)
Hemoglobin: 15.8 g/dL (ref 11.1–15.9)
Immature Grans (Abs): 0 10*3/uL (ref 0.0–0.1)
Immature Granulocytes: 0 %
Lymphocytes Absolute: 3.9 10*3/uL — ABNORMAL HIGH (ref 0.7–3.1)
Lymphs: 60 %
MCH: 30.8 pg (ref 26.6–33.0)
MCHC: 32.2 g/dL (ref 31.5–35.7)
MCV: 96 fL (ref 79–97)
Monocytes Absolute: 0.4 10*3/uL (ref 0.1–0.9)
Monocytes: 6 %
Neutrophils Absolute: 2 10*3/uL (ref 1.4–7.0)
Neutrophils: 30 %
Platelets: 258 10*3/uL (ref 150–450)
RBC: 5.13 x10E6/uL (ref 3.77–5.28)
RDW: 12.5 % (ref 11.7–15.4)
WBC: 6.5 10*3/uL (ref 3.4–10.8)

## 2023-04-05 LAB — VITAMIN D 25 HYDROXY (VIT D DEFICIENCY, FRACTURES): Vit D, 25-Hydroxy: 23.1 ng/mL — ABNORMAL LOW (ref 30.0–100.0)

## 2023-04-05 NOTE — Progress Notes (Signed)
 Slightly elevated hct and lymphocytes. Most likely related to smoking history. Vitamin D remains low. Please pick up medications when possible. All other labs stable.

## 2023-04-06 ENCOUNTER — Telehealth: Payer: Self-pay

## 2023-04-06 NOTE — Progress Notes (Signed)
 Complex Care Management Note  Care Guide Note 04/06/2023 Name: Laura Golden MRN: [id number] DOB: 1965-09-05  Laura Golden is a 58 y.o. year old female who sees Milian, Aleen Campi, FNP for primary care. I reached out to [facility] by phone today to offer complex care management services.  Ms. Mccarroll was given information about Complex Care Management services today including:   The Complex Care Management services include support from the care team which includes your Nurse Care Manager, Clinical Social Worker, or Pharmacist.  The Complex Care Management team is here to help remove barriers to the health concerns and goals most important to you. Complex Care Management services are voluntary, and the patient may decline or stop services at any time by request to their care team member.   Complex Care Management Consent Status: Patient agreed to services and verbal consent obtained.   Follow up plan:  Telephone appointment with complex care management team member scheduled for:  04/16/2023  Encounter Outcome:  Patient Scheduled  Penne Lash , RMA     [place]  [facility], [facility] Guide  Direct Dial: [phone]  Website: [email or web address]

## 2023-04-09 ENCOUNTER — Telehealth: Payer: Self-pay

## 2023-04-09 NOTE — Progress Notes (Signed)
 Complex Care Management Note Care Guide Note  04/09/2023 Name: Laura Golden MRN: [id number] DOB: [birth year]  Laura Golden is a 58 y.o. year old female who is a primary care patient of Ellamae Sia, Aleen Campi, FNP . The community resource team was consulted for assistance with Food Insecurity and utilities.  SDOH screenings and interventions completed:  Yes  Social Drivers of Health From This Encounter   Food Insecurity: No Food Insecurity (04/09/2023)   Hunger Vital Sign    Worried About Running Out of Food in the Last Year: Never true    Ran Out of Food in the Last Year: Never true  Housing: Low Risk  (04/09/2023)   Housing Stability Vital Sign    Unable to Pay for Housing in the Last Year: No    Number of Times Moved in the Last Year: 0    Homeless in the Last Year: No  Financial Resource Strain: Medium Risk (04/09/2023)   Overall Financial Resource Strain (CARDIA)    Difficulty of Paying Living Expenses: Somewhat hard  Transportation Needs: No Transportation Needs (04/09/2023)   PRAPARE - Administrator, Civil Service (Medical): No    Lack of Transportation (Non-Medical): No  Utilities: Not At Risk (04/09/2023)   Utilities    Threatened with loss of utilities: No    SDOH Interventions Today    Flowsheet Row Most Recent Value  SDOH Interventions   Food Insecurity Interventions Other (Comment)  [Patient is currently receiving food stamps. I will mail a [facility] food pantry list.]  Utilities Interventions Other (Comment)  [Patient's Laura Golden bill is not in her name she requested I send [employer] application to her.]        Care guide performed the following interventions: Spoke with patient, she is receiving food stamps but would like to have a list of food pantries mailed to her. Patient's Laura Golden bill is not in her name she requested I send [employer] application to her.  Follow Up Plan:  No further follow up planned at this time. The patient has been provided with  needed resources.  Encounter Outcome:  Patient Visit Completed  [facility]  [facility] Guide Direct Dial: [phone]  Fax: [phone] Website: [email or web address]

## 2023-04-16 ENCOUNTER — Other Ambulatory Visit: Payer: Self-pay

## 2023-04-16 ENCOUNTER — Other Ambulatory Visit: Payer: Self-pay | Admitting: *Deleted

## 2023-04-16 ENCOUNTER — Encounter: Payer: Self-pay | Admitting: *Deleted

## 2023-04-16 NOTE — Patient Instructions (Signed)
 Visit Information  Ms. Nelligan was given information about Medicaid Managed Care team care coordination services as a part of their Healthy [facility] Medicaid benefit. Shakyia Beitzel verbally consented to engagement with the [facility] Managed Care team.   If you are experiencing a medical emergency, please call 911 or report to your local emergency department or urgent care.   If you have a non-emergency medical problem during routine business hours, please contact your provider's office and ask to speak with a nurse.   For questions related to your Healthy [facility] health plan, please call: [phone] or visit the homepage here: [email or web address]  If you would like to schedule transportation through your Healthy [facility] plan, please call the following number at least 2 days in advance of your appointment: [phone]  For information about your ride after you set it up, call Ride Assist at [phone]. Use this number to activate a Will Call pickup, or if your transportation is late for a scheduled pickup. Use this number, too, if you need to make a change or cancel a previously scheduled reservation.  If you need transportation services right away, call [phone]. The after-hours call center is staffed 24 hours to handle ride assistance and urgent reservation requests (including discharges) 365 days a year. Urgent trips include sick visits, hospital discharge requests and life-sustaining treatment.  Call the [facility] Line at [phone], at any time, 24 hours a day, 7 days a week. If you are in danger or need immediate medical attention call 911.  If you would like help to quit smoking, call 1-800-QUIT-NOW (272-067-9896) OR Espaol: 1-855-Djelo-Ya (4-742-595-6387) o para ms informacin haga clic aqu or Text READY to 564-332 to register via text  Laura Golden - following are the goals we discussed in your visit today:   Goals  Addressed             This Visit's Progress    VBCI RN Care Plan: HLD       Problems:  Chronic Disease Management support and education needs related to HLD Knowledge Deficits related to HLD  Goal: Over the next 90 days the Patient will attend all scheduled medical appointments: with primary care provider and specialist as evidenced by keeping all scheduled appointments        demonstrate Improved and Ongoing adherence to prescribed treatment plan for HLD as evidenced by consistent medication compliance, symptom monitoring, lifestyle changes take all medications exactly as prescribed and will call provider for medication related questions as evidenced by compliance with all medications    verbalize basic understanding of HLD disease process and self health management plan as evidenced by verbal explanation, recognizing symptoms, lifestyle modifications  Interventions:   Hyperlipidemia Interventions:  (Status:  Goal on track:  Yes.) Long Term Goal Medication review performed; medication list updated in electronic medical record.  Provider established cholesterol goals reviewed Counseled on importance of regular laboratory monitoring as prescribed Provided HLD educational materials Reviewed role and benefits of statin for ASCVD risk reduction. Reports compliance Pravastatin Discussed strategies to manage statin-induced myalgias. Denies myalgias Reviewed importance of limiting foods high in cholesterol Reviewed exercise goals and target of 150 minutes per week Screening for signs and symptoms of depression related to chronic disease state Assessed social determinant of health barriers  Lab Results  Component Value Date   CHOL 141 07/06/2022   HDL 65 07/06/2022   LDLCALC 60 07/06/2022   TRIG 82 07/06/2022   CHOLHDL 2.2 07/06/2022     Patient Self-Care  Activities:  Attend all scheduled provider appointments Call pharmacy for medication refills 3-7 days in advance of running out  of medications Call provider office for new concerns or questions  Perform all self care activities independently  Perform IADL's (shopping, preparing meals, housekeeping, managing finances) independently Take medications as prescribed   take all medications exactly as prescribed call doctor with any symptoms you believe are related to your medicine call doctor when you experience any new symptoms go to all doctor appointments as scheduled develop an exercise routine  Plan:  Telephone follow up appointment with care management team member scheduled for:  04-23-2023 at 1:45 pm     VBCI RN Care Plan: HTN       Problems:  Chronic Disease Management support and education needs related to HTN Knowledge Deficits related to HTN  Goal: Over the next 90 days the Patient will attend all scheduled medical appointments: with primary care provider and specialist as evidenced by keeping all scheduled appointments        demonstrate Improved and Ongoing adherence to prescribed treatment plan for HTN as evidenced by consistent medication compliance, symptom monitoring, lifestyle changes take all medications exactly as prescribed and will call provider for medication related questions as evidenced by compliance with all medications    verbalize basic understanding of HTN disease process and self health management plan as evidenced by verbal explanation, recognizing symptoms, lifestyle modifications  Interventions:   Hypertension Interventions:  (Status:  Goal on track:  Yes.) Long Term Goal Last practice recorded BP readings:  BP Readings from Last 3 Encounters:  04/11/23 130/65  04/04/23 129/80  01/04/23 109/65   Most recent eGFR/CrCl:  Lab Results  Component Value Date   EGFR 74 04/04/2023    No components found for: "CRCL"  Evaluation of current treatment plan related to hypertension self management and patient's adherence to plan as established by provider. Reports checking BP occasionally.  RNCM advised to check BP daily and record.  Provided education to patient re: stroke prevention, s/s of heart attack and stroke. Education and support provided. Reviewed medications with patient and discussed importance of compliance. Reports compliance with all medications Counseled on adverse effects of illicit drug and excessive alcohol use in patients with high blood pressure. Denies illicit drug or excessive alcohol use Counseled on the importance of exercise goals with target of 150 minutes per week. Walks approximately 3 times per week for around an hour Discussed plans with patient for ongoing care management follow up and provided patient with direct contact information for care management team Advised patient, providing education and rationale, to monitor blood pressure daily and record, calling PCP for findings outside established parameters. RNCM reviewed goal SBP<140 DBP<90 Reviewed scheduled/upcoming provider appointments including: 07-05-2023 with PCP Advised patient to discuss elevated home BP readings with provider Provided education on prescribed diet Heart Healthy Discussed complications of poorly controlled blood pressure such as heart disease, stroke, circulatory complications, vision complications, kidney impairment, sexual dysfunction Screening for signs and symptoms of depression related to chronic disease state  Assessed social determinant of health barriers  Patient Self-Care Activities:  Attend all scheduled provider appointments Call provider office for new concerns or questions  Perform all self care activities independently  Take medications as prescribed   check blood pressure daily write blood pressure results in a log or diary keep a blood pressure log keep all doctor appointments take medications for blood pressure exactly as prescribed  Plan:  Telephone follow up appointment with care management  team member scheduled for:  04-23-2023 at 1:45 pm         Please see education materials related to HTN, HLD provided as print materials.   The patient verbalized understanding of instructions, educational materials, and care plan provided today and agreed to receive a mailed copy of patient instructions, educational materials, and care plan.   Telephone follow up appointment with Managed Medicaid care management team member scheduled for:04-23-2023 at 1:45 pm  Danise Edge, BSN RN [facility], [facility] RN Care Manager Direct Dial: [phone]  Fax: [phone]   Following is a copy of your plan of care:  There are no care plans that you recently modified to display for this patient.

## 2023-04-16 NOTE — Patient Outreach (Signed)
 Complex Care Management   Visit Note  04/16/2023  Name:  Laura Golden MRN: [id number] DOB: 16-Dec-1965  Situation: Referral received for Complex Care Management related to SDOH Barriers:  Food insecurity I obtained verbal consent from patient.  Visit completed with patient  on the phone  Background:   Past Medical History:  Diagnosis Date   Hypertension    Mixed hyperlipidemia 04/20/2020    Assessment: Patient Reported Symptoms:  Cognitive Alert and oriented to person, place, and time  Neurological No symptoms reported    HEENT      Cardiovascular No symptoms reported    Respiratory Dry cough    Endocrine Not assessed    Gastrointestinal No symptoms reported    Genitourinary No symptoms reported    Integumentary No symptoms reported    Musculoskeletal No symptoms reported    Psychosocial No symptoms reported     Vitals:   04/11/23 1022  BP: 130/65    Medications Reviewed Today     Reviewed by Ricky Stabs, RN (Registered Nurse) on 04/16/23 at 1028  Med List Status: <None>   Medication Order Taking? Sig Documenting Provider Last Dose Status Informant  acetaminophen (TYLENOL) 325 MG tablet [id number] Yes Take 325 mg by mouth every 6 (six) hours as needed for mild pain or headache. [provider] Taking Active Self  albuterol (VENTOLIN HFA) 108 (90 Base) MCG/ACT inhaler [id number] Yes Inhale 2 puffs into the lungs every 6 (six) hours as needed for wheezing or shortness of breath. Daphine Deutscher, Mary-Margaret, FNP Taking Active   amLODipine (NORVASC) 5 MG tablet [id number] Yes Take 1 tablet (5 mg total) by mouth daily. Arrie Senate, FNP Taking Active   aspirin 325 MG tablet [id number] No Take 325 mg by mouth daily.  Patient not taking: Reported on 04/16/2023   [provider] Not Taking Consider Medication Status and Discontinue   cetirizine (ZYRTEC) 10 MG tablet [id number] Yes Take 1 tablet (10 mg total) by mouth daily. Arrie Senate,  FNP Taking Active   fluticasone [facility]) 50 MCG/ACT nasal spray [id number] Yes Place 2 sprays into both nostrils daily. Arrie Senate, FNP Taking Active   naproxen (NAPROSYN) 500 MG tablet [id number] No Take 1 tablet (500 mg total) by mouth 2 (two) times daily with a meal. Milian, Aleen Campi, FNP Unknown Active   pravastatin (PRAVACHOL) 40 MG tablet [id number] Yes Take 1 tablet (40 mg total) by mouth every evening. Arrie Senate, FNP Taking Active   triamcinolone cream (KENALOG) 0.1 % [id number] Yes Apply 1 application. topically 2 (two) times daily. Gwenlyn Fudge, FNP Taking Active             Recommendation:   PCP Follow-up  Follow Up Plan:   Telephone follow up appointment date/time:  04-23-2023 at 1:45 pm  Danise Edge, BSN RN [facility], [facility] RN Care Manager Direct Dial: [phone]  Fax: [phone]

## 2023-04-23 ENCOUNTER — Encounter: Payer: Self-pay | Admitting: *Deleted

## 2023-04-23 ENCOUNTER — Other Ambulatory Visit: Payer: Self-pay | Admitting: *Deleted

## 2023-04-23 ENCOUNTER — Telehealth: Payer: Self-pay | Admitting: *Deleted

## 2023-04-23 ENCOUNTER — Other Ambulatory Visit: Payer: Self-pay

## 2023-04-23 NOTE — Patient Instructions (Signed)
 Visit Information  Ms. Dilling was given information about Medicaid Managed Care team care coordination services as a part of their Healthy [facility] Medicaid benefit. Zofia Janssen verbally consented to engagement with the [facility] Managed Care team.   If you are experiencing a medical emergency, please call 911 or report to your local emergency department or urgent care.   If you have a non-emergency medical problem during routine business hours, please contact your provider's office and ask to speak with a nurse.   For questions related to your Healthy [facility] health plan, please call: [phone] or visit the homepage here: [email or web address]  If you would like to schedule transportation through your Healthy [facility] plan, please call the following number at least 2 days in advance of your appointment: [phone]  For information about your ride after you set it up, call Ride Assist at [phone]. Use this number to activate a Will Call pickup, or if your transportation is late for a scheduled pickup. Use this number, too, if you need to make a change or cancel a previously scheduled reservation.  If you need transportation services right away, call [phone]. The after-hours call center is staffed 24 hours to handle ride assistance and urgent reservation requests (including discharges) 365 days a year. Urgent trips include sick visits, hospital discharge requests and life-sustaining treatment.  Call the [facility] Line at [phone], at any time, 24 hours a day, 7 days a week. If you are in danger or need immediate medical attention call 911.  If you would like help to quit smoking, call 1-800-QUIT-NOW ((609) 706-8557) OR Espaol: 1-855-Djelo-Ya (1-884-166-0630) o para ms informacin haga clic aqu or Text READY to 160-109 to register via text  Ms. Horgan - following are the goals we discussed in your visit today:   Goals  Addressed             This Visit's Progress    VBCI RN Care Plan: HLD   On track    Problems:  Chronic Disease Management support and education needs related to HLD Knowledge Deficits related to HLD  Goal: Over the next 90 days the Patient will attend all scheduled medical appointments: with primary care provider and specialist as evidenced by keeping all scheduled appointments        demonstrate Improved and Ongoing adherence to prescribed treatment plan for HLD as evidenced by consistent medication compliance, symptom monitoring, lifestyle changes take all medications exactly as prescribed and will call provider for medication related questions as evidenced by compliance with all medications    verbalize basic understanding of HLD disease process and self health management plan as evidenced by verbal explanation, recognizing symptoms, lifestyle modifications  Interventions:   Hyperlipidemia Interventions:  (Status:  Goal on track:  Yes.) Long Term Goal Medication review performed; medication list updated in electronic medical record.  Provider established cholesterol goals reviewed.  Counseled on importance of regular laboratory monitoring as prescribed. Will need updated labs. Provided HLD educational materials Reviewed role and benefits of statin for ASCVD risk reduction. Reports compliance Pravastatin Discussed strategies to manage statin-induced myalgias. Denies myalgias Reviewed importance of limiting foods high in cholesterol Reviewed exercise goals and target of 150 minutes per week Screening for signs and symptoms of depression related to chronic disease state Assessed social determinant of health barriers  Lab Results  Component Value Date   CHOL 141 07/06/2022   HDL 65 07/06/2022   LDLCALC 60 07/06/2022   TRIG 82 07/06/2022   CHOLHDL 2.2 07/06/2022  Patient Self-Care Activities:  Attend all scheduled provider appointments Call pharmacy for medication refills  3-7 days in advance of running out of medications Call provider office for new concerns or questions  Perform all self care activities independently  Perform IADL's (shopping, preparing meals, housekeeping, managing finances) independently Take medications as prescribed   take all medications exactly as prescribed call doctor with any symptoms you believe are related to your medicine call doctor when you experience any new symptoms go to all doctor appointments as scheduled develop an exercise routine  Plan:  Telephone follow up appointment with care management team member scheduled for:  05-23-2023 at 11:45 am     VBCI RN Care Plan: HTN   On track    Problems:  Chronic Disease Management support and education needs related to HTN Knowledge Deficits related to HTN  Goal: Over the next 90 days the Patient will attend all scheduled medical appointments: with primary care provider and specialist as evidenced by keeping all scheduled appointments        demonstrate Improved and Ongoing adherence to prescribed treatment plan for HTN as evidenced by consistent medication compliance, symptom monitoring, lifestyle changes take all medications exactly as prescribed and will call provider for medication related questions as evidenced by compliance with all medications    verbalize basic understanding of HTN disease process and self health management plan as evidenced by verbal explanation, recognizing symptoms, lifestyle modifications  Interventions:   Hypertension Interventions:  (Status:  Goal on track:  Yes.) Long Term Goal Last practice recorded BP readings:  BP Readings from Last 3 Encounters:  04/23/23 135/86  04/11/23 130/65  04/04/23 129/80   Most recent eGFR/CrCl:  Lab Results  Component Value Date   EGFR 74 04/04/2023    No components found for: "CRCL"  Evaluation of current treatment plan related to hypertension self management and patient's adherence to plan as established by  provider. Checking BP regularly and recording. Denies any chest pain, headache, dizziness at this time. Provided education to patient re: stroke prevention, s/s of heart attack and stroke. Education and support provided. Reviewed medications with patient and discussed importance of compliance. Reports compliance with all medications Counseled on adverse effects of illicit drug and excessive alcohol use in patients with high blood pressure. Denies illicit drug or excessive alcohol use Counseled on the importance of exercise goals with target of 150 minutes per week. Walks approximately 3 times per week for around an hour Discussed plans with patient for ongoing care management follow up and provided patient with direct contact information for care management team Advised patient, providing education and rationale, to monitor blood pressure daily and record, calling PCP for findings outside established parameters. RNCM reviewed goal SBP<140 DBP<90 Reviewed scheduled/upcoming provider appointments including: 07-05-2023 with PCP Advised patient to discuss elevated home BP readings with provider Provided education on prescribed diet Heart Healthy Discussed complications of poorly controlled blood pressure such as heart disease, stroke, circulatory complications, vision complications, kidney impairment, sexual dysfunction Screening for signs and symptoms of depression related to chronic disease state  Assessed social determinant of health barriers  Patient Self-Care Activities:  Attend all scheduled provider appointments Call provider office for new concerns or questions  Perform all self care activities independently  Take medications as prescribed   check blood pressure daily write blood pressure results in a log or diary keep a blood pressure log keep all doctor appointments take medications for blood pressure exactly as prescribed  Plan:  Telephone follow up  appointment with care management  team member scheduled for:  05-23-2023 at 11:45 am          The patient verbalized understanding of instructions, educational materials, and care plan provided today and DECLINED offer to receive copy of patient instructions, educational materials, and care plan.   Telephone follow up appointment with Managed Medicaid care management team member scheduled for:  Grandville Lax, BSN RN [facility], [facility] RN Care Manager Direct Dial: [phone]  Fax: [phone]   Following is a copy of your plan of care:  There are no care plans that you recently modified to display for this patient.

## 2023-04-23 NOTE — Patient Outreach (Signed)
 Complex Care Management   Visit Note  04/23/2023  Name:  Laura Golden MRN: [id number] DOB: [birth year]  Situation: Referral received for Complex Care Management related to SDOH Barriers:  Food insecurity I obtained verbal consent from Patient.  Visit completed with patient  on the phone  Background:   Past Medical History:  Diagnosis Date   Hypertension    Mixed hyperlipidemia 04/20/2020    Assessment: Patient Reported Symptoms:  Cognitive Cognitive Status: Alert and oriented to person, place, and time Cognitive/Intellectual Conditions Management [RPT]: None reported or documented in medical history or problem list   Health Maintenance Behaviors: Annual physical exam, Immunizations Healing Pattern: Average Health Facilitated by: Rest, Healthy diet  Neurological   Neurological Management Strategies: Routine screening Neurological Self-Management Outcome: 4 (good)  HEENT HEENT Symptoms Reported: No symptoms reported HEENT Self-Management Outcome: 4 (good)    Cardiovascular Cardiovascular Symptoms Reported: No symptoms reported Does patient have uncontrolled Hypertension?: No Cardiovascular Conditions: High blood cholesterol, Hypertension Cardiovascular Management Strategies: Medication therapy, Routine screening, Exercise Cardiovascular Self-Management Outcome: 4 (good)  Respiratory Respiratory Symptoms Reported: No symptoms reported Respiratory Conditions: Seasonal allergies Respiratory Self-Management Outcome: 4 (good)  Endocrine Patient reports the following symptoms related to hypoglycemia or hyperglycemia : No symptoms reported Is patient diabetic?: No Endocrine Self-Management Outcome: 4 (good)  Gastrointestinal   Gastrointestinal Self-Management Outcome: 4 (good) Nutrition Risk Screen (CP): No indicators present  Genitourinary   Genitourinary Self-Management Outcome: 4 (good)  Integumentary Integumentary Symptoms Reported: No symptoms reported Skin Management  Strategies: Routine screening Skin Self-Management Outcome: 4 (good)  Musculoskeletal Musculoskelatal Symptoms Reviewed: No symptoms reported Musculoskeletal Management Strategies: Routine screening, Exercise Musculoskeletal Self-Management Outcome: 4 (good) Falls in the past year?: Yes Number of falls in past year: 1 or less Was there an injury with Fall?: Yes Fall Risk Category Calculator: 2 Patient Fall Risk Level: Moderate Fall Risk Patient at Risk for Falls Due to: History of fall(s) Fall risk Follow up: Falls evaluation completed, Education provided, Falls prevention discussed  Psychosocial Psychosocial Symptoms Reported: No symptoms reported Behavioral Health Self-Management Outcome: 4 (good) Major Change/Loss/Stressor/Fears (CP): Denies Techniques to Cope with Loss/Stress/Change: Not applicable Quality of Family Relationships: non-existent Do you feel physically threatened by others?: No      04/23/2023    2:06 PM  Depression screen PHQ 2/9  Decreased Interest 0  Down, Depressed, Hopeless 0  PHQ - 2 Score 0  Altered sleeping 0  Tired, decreased energy 0  Change in appetite 0  Feeling bad or failure about yourself  0  Trouble concentrating 0  Moving slowly or fidgety/restless 0  Suicidal thoughts 0  PHQ-9 Score 0  Difficult doing work/chores Not difficult at all    Vitals:   04/23/23 1356  BP: 135/86    Medications Reviewed Today     Reviewed by Ricky Stabs, RN (Registered Nurse) on 04/23/23 at 1359  Med List Status: <None>   Medication Order Taking? Sig Documenting Provider Last Dose Status Informant  acetaminophen (TYLENOL) 325 MG tablet [id number] Yes Take 325 mg by mouth every 6 (six) hours as needed for mild pain or headache. [provider] Taking Active Self  albuterol (VENTOLIN HFA) 108 (90 Base) MCG/ACT inhaler [id number] Yes Inhale 2 puffs into the lungs every 6 (six) hours as needed for wheezing or shortness of breath. Daphine Deutscher,  Mary-Margaret, FNP Taking Active   amLODipine (NORVASC) 5 MG tablet [id number] Yes Take 1 tablet (5 mg total) by mouth daily. Arrie Senate, FNP Taking Active  aspirin 325 MG tablet [id number] No Take 325 mg by mouth daily.  Patient not taking: Reported on 04/04/2023   [provider] Not Taking Consider Medication Status and Discontinue   cetirizine (ZYRTEC) 10 MG tablet [id number] No Take 1 tablet (10 mg total) by mouth daily.  Patient not taking: Reported on 04/23/2023   Chrystine Crate, FNP Not Taking Active   fluticasone [facility]) 50 MCG/ACT nasal spray [id number] Yes Place 2 sprays into both nostrils daily. Chrystine Crate, FNP Taking Active   naproxen (NAPROSYN) 500 MG tablet [id number] No Take 1 tablet (500 mg total) by mouth 2 (two) times daily with a meal.  Patient not taking: Reported on 04/23/2023   Chrystine Crate, FNP Not Taking Consider Medication Status and Discontinue   pravastatin (PRAVACHOL) 40 MG tablet [id number] Yes Take 1 tablet (40 mg total) by mouth every evening. Chrystine Crate, FNP Taking Active   triamcinolone cream (KENALOG) 0.1 % [id number] Yes Apply 1 application. topically 2 (two) times daily. Zorita Hiss, FNP Taking Active             Recommendation:   Lab requests: lipid panel  Follow Up Plan:   Telephone follow up appointment date/time:  05-23-2023 at 11:45 am  Grandville Lax, BSN RN [facility], [facility] RN Care Manager Direct Dial: [phone]  Fax: [phone]

## 2023-05-17 ENCOUNTER — Telehealth: Payer: Self-pay | Admitting: Family Medicine

## 2023-05-17 NOTE — Telephone Encounter (Signed)
 Copied from CRM [id number]. Topic: Referral - Question >> May 17, 2023 10:33 AM Carrielelia G wrote: Please call Pt Laura Golden regarding her referral to  [facility] Pulmonary Care at [facility] [address] [place], [place]  [address] Phone:  [phone]   Fax:  [phone]  Lung Cancer Screening  She states she does not want to travel to [facility] she would like to go to [place]    (The Letter was sent out by Deann Exon, RN on 09/06/2022)

## 2023-05-18 NOTE — Telephone Encounter (Signed)
 NA

## 2023-05-18 NOTE — Telephone Encounter (Signed)
 Patient aware and verbalized understanding.

## 2023-05-18 NOTE — Telephone Encounter (Signed)
 I have r/c to Patient - No answer at this time.  Needing to explain to Patient that these are scheduled by the Pulmonary Office and they no longer have the Program at [facility] with the [facility] Nurses/MD's.

## 2023-05-23 ENCOUNTER — Other Ambulatory Visit: Payer: Self-pay | Admitting: *Deleted

## 2023-05-23 NOTE — Patient Instructions (Signed)
 Visit Information  Laura Golden was given information about Medicaid Managed Care team care coordination services as a part of their Healthy [facility] Medicaid benefit. Allen Nokes verbally consented to engagement with the [facility] Managed Care team.   If you are experiencing a medical emergency, please call 911 or report to your local emergency department or urgent care.   If you have a non-emergency medical problem during routine business hours, please contact your provider's office and ask to speak with a nurse.   For questions related to your Healthy [facility] health plan, please call: [phone] or visit the homepage here: [email or web address]  If you would like to schedule transportation through your Healthy [facility] plan, please call the following number at least 2 days in advance of your appointment: [phone]  For information about your ride after you set it up, call Ride Assist at [phone]. Use this number to activate a Will Call pickup, or if your transportation is late for a scheduled pickup. Use this number, too, if you need to make a change or cancel a previously scheduled reservation.  If you need transportation services right away, call [phone]. The after-hours call center is staffed 24 hours to handle ride assistance and urgent reservation requests (including discharges) 365 days a year. Urgent trips include sick visits, hospital discharge requests and life-sustaining treatment.  Call the [facility] Line at [phone], at any time, 24 hours a day, 7 days a week. If you are in danger or need immediate medical attention call 911.  If you would like help to quit smoking, call 1-800-QUIT-NOW ((443)534-8629) OR Espaol: 1-855-Djelo-Ya (8-756-433-2951) o para ms informacin haga clic aqu or Text READY to 884-166 to register via text  Ms. Bassin - following are the goals we discussed in your visit today:   Goals  Addressed             This Visit's Progress    VBCI RN Care Plan: HLD       Problems:  Chronic Disease Management support and education needs related to HLD Knowledge Deficits related to HLD  Goal: Over the next 90 days the Patient will attend all scheduled medical appointments: with primary care provider and specialist as evidenced by keeping all scheduled appointments        demonstrate Improved and Ongoing adherence to prescribed treatment plan for HLD as evidenced by consistent medication compliance, symptom monitoring, lifestyle changes take all medications exactly as prescribed and will call provider for medication related questions as evidenced by compliance with all medications    verbalize basic understanding of HLD disease process and self health management plan as evidenced by verbal explanation, recognizing symptoms, lifestyle modifications  Interventions:   Hyperlipidemia Interventions:  (Status:  Goal on track:  Yes.) Long Term Goal Medication review performed; medication list updated in electronic medical record.  Provider established cholesterol goals reviewed. At goal. Patient reports lifestyle modifications and continues with improvements Counseled on importance of regular laboratory monitoring as prescribed. Will need updated labs. Provided HLD educational materials Reviewed role and benefits of statin for ASCVD risk reduction. Reports compliance Pravastatin  Discussed strategies to manage statin-induced myalgias. Denies myalgias Reviewed importance of limiting foods high in cholesterol Reviewed exercise goals and target of 150 minutes per week. Reports walking daily for exercise Screening for signs and symptoms of depression related to chronic disease state Assessed social determinant of health barriers  Lab Results  Component Value Date   CHOL 141 07/06/2022   HDL 65 07/06/2022  LDLCALC 60 07/06/2022   TRIG 82 07/06/2022   CHOLHDL 2.2 07/06/2022      Patient Self-Care Activities:  Attend all scheduled provider appointments Call pharmacy for medication refills 3-7 days in advance of running out of medications Call provider office for new concerns or questions  Perform all self care activities independently  Perform IADL's (shopping, preparing meals, housekeeping, managing finances) independently Take medications as prescribed   take all medications exactly as prescribed call doctor with any symptoms you believe are related to your medicine call doctor when you experience any new symptoms go to all doctor appointments as scheduled develop an exercise routine  Plan:  Telephone follow up appointment with care management team member scheduled for:  07-06-2023 at 11:45 am     VBCI RN Care Plan: HTN   On track    Problems:  Chronic Disease Management support and education needs related to HTN Knowledge Deficits related to HTN  Goal: Over the next 90 days the Patient will attend all scheduled medical appointments: with primary care provider and specialist as evidenced by keeping all scheduled appointments        demonstrate Improved and Ongoing adherence to prescribed treatment plan for HTN as evidenced by consistent medication compliance, symptom monitoring, lifestyle changes take all medications exactly as prescribed and will call provider for medication related questions as evidenced by compliance with all medications    verbalize basic understanding of HTN disease process and self health management plan as evidenced by verbal explanation, recognizing symptoms, lifestyle modifications  Interventions:   Hypertension Interventions:  (Status:  Goal on track:  Yes.) Long Term Goal Last practice recorded BP readings:  BP Readings from Last 3 Encounters:  05/22/23 119/69  04/23/23 135/86  04/11/23 130/65   Most recent eGFR/CrCl:  Lab Results  Component Value Date   EGFR 74 04/04/2023    No components found for:  "CRCL"  Evaluation of current treatment plan related to hypertension self management and patient's adherence to plan as established by provider. Checking BP regularly and recording. Denies any chest pain, headache, dizziness or swelling at this time. Patient reports the following BP readings: Date BP  05/05/23 118/76  05/06/23 134/85  05/14/23 134/86  05/22/23 119/69   Provided education to patient re: stroke prevention, s/s of heart attack and stroke. Education and support provided. Reviewed medications with patient and discussed importance of compliance. Reports compliance with all medications Counseled on adverse effects of illicit drug and excessive alcohol use in patients with high blood pressure. Denies illicit drug or excessive alcohol use Counseled on the importance of exercise goals with target of 150 minutes per week. Walks approximately 3 times per week for around an hour Discussed plans with patient for ongoing care management follow up and provided patient with direct contact information for care management team Advised patient, providing education and rationale, to monitor blood pressure daily and record, calling PCP for findings outside established parameters. RNCM reviewed goal SBP<140 DBP<90 Reviewed scheduled/upcoming provider appointments including: 07-05-2023 with PCP Advised patient to discuss elevated home BP readings with provider Provided education on prescribed diet Heart Healthy Discussed complications of poorly controlled blood pressure such as heart disease, stroke, circulatory complications, vision complications, kidney impairment, sexual dysfunction Screening for signs and symptoms of depression related to chronic disease state  Assessed social determinant of health barriers  Patient Self-Care Activities:  Attend all scheduled provider appointments Call provider office for new concerns or questions  Perform all self care activities independently  Take  medications as  prescribed   check blood pressure daily write blood pressure results in a log or diary keep a blood pressure log keep all doctor appointments take medications for blood pressure exactly as prescribed  Plan:  Telephone follow up appointment with care management team member scheduled for:  07-06-2023 at 11:45 am        Please see education materials related to Hypertension provided by MyChart link.  The patient verbalized understanding of instructions, educational materials, and care plan provided today and DECLINED offer to receive copy of patient instructions, educational materials, and care plan.   Telephone follow up appointment with Managed Medicaid care management team member scheduled for:07-06-2023 at 11:45 am  Grandville Lax, BSN RN [facility], [facility] Health RN Care Manager Direct Dial: [phone]  Fax: [phone]   DASH Eating Plan DASH stands for Dietary Approaches to Stop Hypertension. The DASH eating plan is a healthy eating plan that has been shown to: Lower high blood pressure (hypertension). Reduce your risk for type 2 diabetes, heart disease, and stroke. Help with weight loss. What are tips for following this plan? Reading food labels Check food labels for the amount of salt (sodium) per serving. Choose foods with less than 5 percent of the Daily Value (DV) of sodium. In general, foods with less than 300 milligrams (mg) of sodium per serving fit into this eating plan. To find whole grains, look for the word "whole" as the first word in the ingredient list. Shopping Buy products labeled as "low-sodium" or "no salt added." Buy fresh foods. Avoid canned foods and pre-made or frozen meals. Cooking Try not to add salt when you cook. Use salt-free seasonings or herbs instead of table salt or sea salt. Check with your health care provider or pharmacist before using salt substitutes. Do not fry foods. Cook foods in healthy ways, such  as baking, boiling, grilling, roasting, or broiling. Cook using oils that are good for your heart. These include olive, canola, avocado, soybean, and sunflower oil. Meal planning  Eat a balanced diet. This should include: 4 or more servings of fruits and 4 or more servings of vegetables each day. Try to fill half of your plate with fruits and vegetables. 6-8 servings of whole grains each day. 6 or less servings of lean meat, poultry, or fish each day. 1 oz is 1 serving. A 3 oz (85 g) serving of meat is about the same size as the palm of your hand. One egg is 1 oz (28 g). 2-3 servings of low-fat dairy each day. One serving is 1 cup (237 mL). 1 serving of nuts, seeds, or beans 5 times each week. 2-3 servings of heart-healthy fats. Healthy fats called omega-3 fatty acids are found in foods such as walnuts, flaxseeds, fortified milks, and eggs. These fats are also found in cold-water fish, such as sardines, salmon, and mackerel. Limit how much you eat of: Canned or prepackaged foods. Food that is high in trans fat, such as fried foods. Food that is high in saturated fat, such as fatty meat. Desserts and other sweets, sugary drinks, and other foods with added sugar. Full-fat dairy products. Do not salt foods before eating. Do not eat more than 4 egg yolks a week. Try to eat at least 2 vegetarian meals a week. Eat more home-cooked food and less restaurant, buffet, and fast food. Lifestyle When eating at a restaurant, ask if your food can be made with less salt or no salt. If  you drink alcohol: Limit how much you have to: 0-1 drink a day if you are female. 0-2 drinks a day if you are female. Know how much alcohol is in your drink. In the [place] one drink is one 12 oz bottle of beer (355 mL), one 5 oz glass of wine (148 mL), or one 1 oz glass of hard liquor (44 mL). General information Avoid eating more than 2,300 mg of salt a day. If you have hypertension, you may need to reduce your sodium  intake to 1,500 mg a day. Work with your provider to stay at a healthy body weight or lose weight. Ask what the best weight range is for you. On most days of the week, get at least 30 minutes of exercise that causes your heart to beat faster. This may include walking, swimming, or biking. Work with your provider or dietitian to adjust your eating plan to meet your specific calorie needs. What foods should I eat? Fruits All fresh, dried, or frozen fruit. Canned fruits that are in their natural juice and do not have sugar added to them. Vegetables Fresh or frozen vegetables that are raw, steamed, roasted, or grilled. Low-sodium or reduced-sodium tomato and vegetable juice. Low-sodium or reduced-sodium tomato sauce and tomato paste. Low-sodium or reduced-sodium canned vegetables. Grains Whole-grain or whole-wheat bread. Whole-grain or whole-wheat pasta. Brown rice. Dwyane Glad. Bulgur. Whole-grain and low-sodium cereals. Pita bread. Low-fat, low-sodium crackers. Whole-wheat flour tortillas. Meats and other proteins Skinless chicken or [place]. Ground chicken or [place]. Pork with fat trimmed off. Fish and seafood. Egg whites. Dried beans, peas, or lentils. Unsalted nuts, nut butters, and seeds. Unsalted canned beans. Lean cuts of beef with fat trimmed off. Low-sodium, lean precooked or cured meat, such as sausages or meat loaves. Dairy Low-fat (1%) or fat-free (skim) milk. Reduced-fat, low-fat, or fat-free cheeses. Nonfat, low-sodium ricotta or cottage cheese. Low-fat or nonfat yogurt. Low-fat, low-sodium cheese. Fats and oils Soft margarine without trans fats. Vegetable oil. Reduced-fat, low-fat, or light mayonnaise and salad dressings (reduced-sodium). Canola, safflower, olive, avocado, soybean, and sunflower oils. Avocado. Seasonings and condiments Herbs. Spices. Seasoning mixes without salt. Other foods Unsalted popcorn and pretzels. Fat-free sweets. The items listed above may not be all the  foods and drinks you can have. Talk to a dietitian to learn more. What foods should I avoid? Fruits Canned fruit in a light or heavy syrup. Fried fruit. Fruit in cream or butter sauce. Vegetables Creamed or fried vegetables. Vegetables in a cheese sauce. Regular canned vegetables that are not marked as low-sodium or reduced-sodium. Regular canned tomato sauce and paste that are not marked as low-sodium or reduced-sodium. Regular tomato and vegetable juices that are not marked as low-sodium or reduced-sodium. Vanessa General. Olives. Grains Baked goods made with fat, such as croissants, muffins, or some breads. Dry pasta or rice meal packs. Meats and other proteins Fatty cuts of meat. Ribs. Fried meat. Helene Loader. Bologna, salami, and other precooked or cured meats, such as sausages or meat loaves, that are not lean and low in sodium. Fat from the back of a pig (fatback). Bratwurst. Salted nuts and seeds. Canned beans with added salt. Canned or smoked fish. Whole eggs or egg yolks. Chicken or [place] with skin. Dairy Whole or 2% milk, cream, and half-and-half. Whole or full-fat cream cheese. Whole-fat or sweetened yogurt. Full-fat cheese. Nondairy creamers. Whipped toppings. Processed cheese and cheese spreads. Fats and oils Butter. Stick margarine. Lard. Shortening. Ghee. Bacon fat. Tropical oils, such as coconut, palm kernel,  or palm oil. Seasonings and condiments Onion salt, garlic salt, seasoned salt, table salt, and sea salt. Worcestershire sauce. Tartar sauce. Barbecue sauce. Teriyaki sauce. Soy sauce, including reduced-sodium soy sauce. Steak sauce. Canned and packaged gravies. Fish sauce. Oyster sauce. Cocktail sauce. Store-bought horseradish. Ketchup. Mustard. Meat flavorings and tenderizers. Bouillon cubes. Hot sauces. Pre-made or packaged marinades. Pre-made or packaged taco seasonings. Relishes. Regular salad dressings. Other foods Salted popcorn and pretzels. The items listed above may not be all the  foods and drinks you should avoid. Talk to a dietitian to learn more. Where to find more information National Heart, Lung, and Blood Institute (NHLBI): BuffaloDryCleaner.gl American Heart Association (AHA): heart.org Academy of Nutrition and Dietetics: eatright.org National Kidney Foundation (NKF): kidney.org This information is not intended to replace advice given to you by your health care provider. Make sure you discuss any questions you have with your health care provider. Document Revised: 01/12/2022 Document Reviewed: 01/12/2022 Elsevier Patient Education  2024 [employer].  Following is a copy of your plan of care:  There are no care plans that you recently modified to display for this patient.

## 2023-05-23 NOTE — Patient Outreach (Signed)
 Complex Care Management   Visit Note  05/23/2023  Name:  Laura Golden MRN: [id number] DOB: [birth year]  Situation: Referral received for Complex Care Management related to HTN, HLD I obtained verbal consent from Patient.  Visit completed with patient  on the phone  Background:   Past Medical History:  Diagnosis Date   Hypertension    Mixed hyperlipidemia 04/20/2020    Assessment: Patient Reported Symptoms:  Cognitive Cognitive Status: Alert and oriented to person, place, and time Cognitive/Intellectual Conditions Management [RPT]: None reported or documented in medical history or problem list   Health Maintenance Behaviors: Annual physical exam, Exercise Healing Pattern: Average Health Facilitated by: Rest  Neurological Neurological Review of Symptoms: No symptoms reported    HEENT HEENT Symptoms Reported: No symptoms reported      Cardiovascular Cardiovascular Symptoms Reported: No symptoms reported Cardiovascular Conditions: Hypertension, High blood cholesterol Cardiovascular Management Strategies: Medication therapy, Routine screening, Exercise Cardiovascular Self-Management Outcome: 4 (good)  Respiratory Respiratory Symptoms Reported: No symptoms reported    Endocrine Patient reports the following symptoms related to hypoglycemia or hyperglycemia : No symptoms reported Is patient diabetic?: No    Gastrointestinal Gastrointestinal Symptoms Reported: No symptoms reported   Nutrition Risk Screen (CP): No indicators present  Genitourinary Genitourinary Symptoms Reported: No symptoms reported    Integumentary Integumentary Symptoms Reported: No symptoms reported    Musculoskeletal Musculoskelatal Symptoms Reviewed: No symptoms reported   Falls in the past year?: No Number of falls in past year: 1 or less Was there an injury with Fall?: No Fall Risk Category Calculator: 0 Patient Fall Risk Level: Low Fall Risk Patient at Risk for Falls Due to: No Fall Risks Fall risk  Follow up: Falls evaluation completed  Psychosocial       Quality of Family Relationships: involved, helpful, supportive Do you feel physically threatened by others?: No      05/23/2023   12:02 PM  Depression screen PHQ 2/9  Decreased Interest 0  Down, Depressed, Hopeless 0  PHQ - 2 Score 0    Vitals:   05/22/23 0800  BP: 119/69    Medications Reviewed Today     Reviewed by Remona Carmel, RN (Registered Nurse) on 05/23/23 at 1154  Med List Status: <None>   Medication Order Taking? Sig Documenting Provider Last Dose Status Informant  acetaminophen (TYLENOL) 325 MG tablet [id number] Yes Take 325 mg by mouth every 6 (six) hours as needed for mild pain or headache. [provider] Taking Active Self  albuterol  (VENTOLIN  HFA) 108 (90 Base) MCG/ACT inhaler [id number] Yes Inhale 2 puffs into the lungs every 6 (six) hours as needed for wheezing or shortness of breath. Delfina Feller, FNP Taking Active   amLODipine  (NORVASC ) 5 MG tablet [id number] Yes Take 1 tablet (5 mg total) by mouth daily. Chrystine Crate, FNP Taking Active   aspirin 325 MG tablet [id number] No Take 325 mg by mouth daily.  Patient not taking: Reported on 05/23/2023   [provider] Not Taking Active   cetirizine  (ZYRTEC ) 10 MG tablet [id number] No Take 1 tablet (10 mg total) by mouth daily.  Patient not taking: Reported on 05/23/2023   Chrystine Crate, FNP Not Taking Active   fluticasone  (FLONASE ) 50 MCG/ACT nasal spray [id number] Yes Place 2 sprays into both nostrils daily. Chrystine Crate, FNP Taking Active   naproxen  (NAPROSYN ) 500 MG tablet [id number] No Take 1 tablet (500 mg total) by mouth 2 (two) times daily with a meal.  Patient not taking: Reported  on 05/23/2023   Chrystine Crate, FNP Not Taking Active   pravastatin  (PRAVACHOL ) 40 MG tablet [id number] Yes Take 1 tablet (40 mg total) by mouth every evening. Chrystine Crate, FNP Taking Active    triamcinolone  cream (KENALOG ) 0.1 % [id number] Yes Apply 1 application. topically 2 (two) times daily. Zorita Hiss, FNP Taking Active             Recommendation:   Lab requests: Lipid Panel  Follow Up Plan:   Telephone follow up appointment date/time:  07-06-2023 at 11:45 am  Grandville Lax, BSN RN [facility], [facility] RN Care Manager Direct Dial: [phone]  Fax: [phone]

## 2023-06-05 ENCOUNTER — Telehealth: Payer: Self-pay | Admitting: Family Medicine

## 2023-06-05 DIAGNOSIS — I10 Essential (primary) hypertension: Secondary | ICD-10-CM

## 2023-06-05 NOTE — Telephone Encounter (Unsigned)
 Copied from CRM [id number]. Topic: Clinical - Medication Refill >> Jun 05, 2023 10:43 AM Emylou G wrote: Medication: amLODipine  (NORVASC ) 5 MG tablet  Has the patient contacted their pharmacy? No (Agent: If no, request that the patient contact the pharmacy for the refill. If patient does not wish to contact the pharmacy document the reason why and proceed with request.) (Agent: If yes, when and what did the pharmacy advise?)  This is the patient's preferred pharmacy:  [facility], [place] - [address] [place] [address] Phone: [phone] Fax: [phone]  Is this the correct pharmacy for this prescription? Yes If no, delete pharmacy and type the correct one.   Has the prescription been filled recently? Yes  Is the patient out of the medication? Yes  Has the patient been seen for an appointment in the last year OR does the patient have an upcoming appointment? Yes  Can we respond through MyChart? No  Agent: Please be advised that Rx refills may take up to 3 business days. We ask that you follow-up with your pharmacy.

## 2023-06-06 MED ORDER — AMLODIPINE BESYLATE 5 MG PO TABS: 5.0000 mg | ORAL_TABLET | Freq: Every day | ORAL | 0 refills | Status: DC

## 2023-07-05 ENCOUNTER — Ambulatory Visit: Admitting: Family Medicine

## 2023-07-05 ENCOUNTER — Encounter: Payer: Self-pay | Admitting: Family Medicine

## 2023-07-05 ENCOUNTER — Telehealth: Payer: Self-pay

## 2023-07-05 VITALS — BP 119/75 | HR 67 | Temp 98.0°F | Ht 59.0 in | Wt 101.0 lb

## 2023-07-05 DIAGNOSIS — N898 Other specified noninflammatory disorders of vagina: Secondary | ICD-10-CM

## 2023-07-05 DIAGNOSIS — E782 Mixed hyperlipidemia: Secondary | ICD-10-CM

## 2023-07-05 DIAGNOSIS — R0602 Shortness of breath: Secondary | ICD-10-CM

## 2023-07-05 DIAGNOSIS — Z720 Tobacco use: Secondary | ICD-10-CM | POA: Diagnosis not present

## 2023-07-05 DIAGNOSIS — I10 Essential (primary) hypertension: Secondary | ICD-10-CM

## 2023-07-05 DIAGNOSIS — R7303 Prediabetes: Secondary | ICD-10-CM | POA: Diagnosis not present

## 2023-07-05 DIAGNOSIS — R232 Flushing: Secondary | ICD-10-CM

## 2023-07-05 LAB — URINALYSIS, ROUTINE W REFLEX MICROSCOPIC
Bilirubin, UA: NEGATIVE
Glucose, UA: NEGATIVE
Ketones, UA: NEGATIVE
Nitrite, UA: NEGATIVE
Specific Gravity, UA: >1.03 — ABNORMAL HIGH (ref 1.005–1.030)
Urobilinogen, Ur: 0.2 mg/dL (ref 0.2–1.0)
pH, UA: 6 (ref 5.0–7.5)

## 2023-07-05 LAB — WET PREP FOR TRICH, YEAST, CLUE
Clue Cell Exam: NEGATIVE
Trichomonas Exam: NEGATIVE
Yeast Exam: NEGATIVE

## 2023-07-05 LAB — MICROSCOPIC EXAMINATION
RBC, Urine: NONE SEEN /HPF (ref 0–2)
Renal Epithel, UA: NONE SEEN /HPF
Yeast, UA: NONE SEEN

## 2023-07-05 MED ORDER — VENLAFAXINE HCL ER 37.5 MG PO CP24: 37.5000 mg | ORAL_CAPSULE | Freq: Every day | ORAL | 0 refills | Status: DC

## 2023-07-05 NOTE — Telephone Encounter (Signed)
Patient aware medication was sent.

## 2023-07-05 NOTE — Progress Notes (Signed)
 Subjective:  Patient ID: Laura Golden, female    DOB: 1965/11/18, 58 y.o.   MRN: [id number]  Patient Care Team: Cathlene Marry Lenis, FNP as PCP - General (Family Medicine) Bertrum Rosina HERO, RN as [facility] Management Bertrum Rosina HERO, RN   Chief Complaint:  Medical Management of Chronic Issues  HPI: Laura Golden is a 58 y.o. female presenting on 07/05/2023 for Medical Management of Chronic Issues  HPI 1. Hot flashes States that hot flashes are still happening. She reports sweating with it as well. She is not currently taking prozac . She has not tried Careers information officer.   2. Primary hypertension Has BP monitor at home Yes BP at home average - she can not verbalize average at home.  ROS Denies peripheral edema, changes to vision, headaches, palpitations, Reports some right sided sharp pain that lasts a few seconds and it happens when she is working.  CAD risks smoker, hypertension, hypercholesterolemia/hyperlipidemia States that she wakes up short of breath in the morning. States that she has to sit and take deep breaths.  Denies increased cough or sputum.   3. Mixed hyperlipidemia/4. Prediabetes She is working on limiting carbs  Walking frequently.   5. Tobacco abuse She is still smoking Cigarettes 1/4 PPD  She has CT ordered but needed to reschedule.    6. Vaginal itching States that it started 6 months ago. She denies rash. States that both inside and outside are itching. She is scratching to the point of bleeding. States that it is harder to urinate and burns. Feels that she cannot wash herself well due to the itching. She feels that her labia is swollen. States that it is only when it is sweaty that there is a smell or odor. Denies history of HSV.   Relevant past medical, surgical, family, and social history reviewed and updated as indicated.  Allergies and medications reviewed and updated. Data reviewed: Chart in Epic.   Past Medical History:  Diagnosis Date    Hypertension    Mixed hyperlipidemia 04/20/2020    Past Surgical History:  Procedure Laterality Date   foot sx     TOTAL ABDOMINAL HYSTERECTOMY      Social History   Socioeconomic History   Marital status: Single    Spouse name: Not on file   Number of children: Not on file   Years of education: Not on file   Highest education level: Not on file  Occupational History   Not on file  Tobacco Use   Smoking status: Every Day    Current packs/day: 0.25    Average packs/day: 0.3 packs/day for 42.0 years (10.5 ttl pk-yrs)    Types: Cigarettes    Start date: 07/21/1981   Smokeless tobacco: Never  Vaping Use   Vaping status: Never Used  Substance and Sexual Activity   Alcohol use: Yes    Comment: occ   Drug use: Never   Sexual activity: Not Currently  Other Topics Concern   Not on file  Social History Narrative   Not on file   Social Drivers of Health   Financial Resource Strain: Medium Risk (04/16/2023)   Overall Financial Resource Strain (CARDIA)    Difficulty of Paying Living Expenses: Somewhat hard  Food Insecurity: No Food Insecurity (05/23/2023)   Hunger Vital Sign    Worried About Running Out of Food in the Last Year: Never true    Ran Out of Food in the Last Year: Never true  Transportation Needs: No Transportation Needs (  05/23/2023)   PRAPARE - Administrator, Civil Service (Medical): No    Lack of Transportation (Non-Medical): No  Physical Activity: Sufficiently Active (04/16/2023)   Exercise Vital Sign    Days of Exercise per Week: 3 days    Minutes of Exercise per Session: 60 min  Stress: No Stress Concern Present (04/16/2023)   [employer] of Occupational Health - Occupational Stress Questionnaire    Feeling of Stress : Not at all  Social Connections: Socially Isolated (04/16/2023)   Social Connection and Isolation Panel    Frequency of Communication with Friends and Family: Never    Frequency of Social Gatherings with Friends and Family: Never     Attends Religious Services: Never    Database administrator or Organizations: No    Attends Banker Meetings: Never    Marital Status: Never married  Intimate Partner Violence: Not At Risk (05/23/2023)   Humiliation, Afraid, Rape, and Kick questionnaire    Fear of Current or Ex-Partner: No    Emotionally Abused: No    Physically Abused: No    Sexually Abused: No    Outpatient Encounter Medications as of 07/05/2023  Medication Sig   acetaminophen (TYLENOL) 325 MG tablet Take 325 mg by mouth every 6 (six) hours as needed for mild pain or headache.   albuterol  (VENTOLIN  HFA) 108 (90 Base) MCG/ACT inhaler Inhale 2 puffs into the lungs every 6 (six) hours as needed for wheezing or shortness of breath.   amLODipine  (NORVASC ) 5 MG tablet Take 1 tablet (5 mg total) by mouth daily.   fluticasone  (FLONASE ) 50 MCG/ACT nasal spray Place 2 sprays into both nostrils daily.   pravastatin  (PRAVACHOL ) 40 MG tablet Take 1 tablet (40 mg total) by mouth every evening.   triamcinolone  cream (KENALOG ) 0.1 % Apply 1 application. topically 2 (two) times daily.   [DISCONTINUED] aspirin 325 MG tablet Take 325 mg by mouth daily. (Patient not taking: Reported on 05/23/2023)   [DISCONTINUED] cetirizine  (ZYRTEC ) 10 MG tablet Take 1 tablet (10 mg total) by mouth daily. (Patient not taking: Reported on 05/23/2023)   [DISCONTINUED] naproxen  (NAPROSYN ) 500 MG tablet Take 1 tablet (500 mg total) by mouth 2 (two) times daily with a meal. (Patient not taking: Reported on 05/23/2023)   No facility-administered encounter medications on file as of 07/05/2023.    Allergies  Allergen Reactions   Lisinopril  Swelling    Facial swelling    Penicillins     Review of Systems As per HPI  Objective:  BP 119/75   Pulse 67   Temp 98 F (36.7 C)   Ht 4' 11 (1.499 m)   Wt 101 lb (45.8 kg)   SpO2 97%   BMI 20.40 kg/m    Wt Readings from Last 3 Encounters:  07/05/23 101 lb (45.8 kg)  04/04/23 103 lb (46.7 kg)   01/04/23 102 lb (46.3 kg)   Physical Exam Chaperone present: declined chaperone.  Constitutional:      General: She is awake. She is not in acute distress.    Appearance: Normal appearance. She is well-developed and well-groomed. She is not ill-appearing, toxic-appearing or diaphoretic.   Cardiovascular:     Rate and Rhythm: Normal rate and regular rhythm.     Pulses: Normal pulses.          Radial pulses are 2+ on the right side and 2+ on the left side.       Posterior tibial pulses are 2+ on the  right side and 2+ on the left side.     Heart sounds: Normal heart sounds. No murmur heard.    No gallop.  Pulmonary:     Effort: Pulmonary effort is normal. No respiratory distress.     Breath sounds: Normal breath sounds. No stridor. No wheezing, rhonchi or rales.  Genitourinary:    Exam position: Lithotomy position.     Pubic Area: No pubic lice.      Tanner stage (genital): 5.     Labia:        Right: Tenderness and lesion present.        Left: Tenderness and lesion present.      Urethra: Urethral pain, urethral swelling and urethral lesion present. No prolapse.     Vagina: No signs of injury and foreign body. Erythema and tenderness present. No vaginal discharge, bleeding, lesions or prolapsed vaginal walls.     Comments: Erythematous, excoriated tissue at urethra   Musculoskeletal:     Cervical back: Full passive range of motion without pain and neck supple.     Right lower leg: No edema.     Left lower leg: No edema.   Skin:    General: Skin is warm.     Capillary Refill: Capillary refill takes less than 2 seconds.   Neurological:     General: No focal deficit present.     Mental Status: She is alert, oriented to person, place, and time and easily aroused. Mental status is at baseline.     GCS: GCS eye subscore is 4. GCS verbal subscore is 5. GCS motor subscore is 6.     Motor: No weakness.   Psychiatric:        Attention and Perception: Attention and perception normal.         Mood and Affect: Mood and affect normal.        Speech: Speech normal.        Behavior: Behavior normal. Behavior is cooperative.        Thought Content: Thought content normal. Thought content does not include homicidal or suicidal ideation. Thought content does not include homicidal or suicidal plan.        Cognition and Memory: Cognition and memory normal.        Judgment: Judgment normal.     Results for orders placed or performed in visit on 04/04/23  CBC with Differential/Platelet   Collection Time: 04/04/23  8:49 AM  Result Value Ref Range   WBC 6.5 3.4 - 10.8 x10E3/uL   RBC 5.13 3.77 - 5.28 x10E6/uL   Hemoglobin 15.8 11.1 - 15.9 g/dL   Hematocrit 50.8 (H) 65.9 - 46.6 %   MCV 96 79 - 97 fL   MCH 30.8 26.6 - 33.0 pg   MCHC 32.2 31.5 - 35.7 g/dL   RDW 87.4 88.2 - 84.5 %   Platelets 258 150 - 450 x10E3/uL   Neutrophils 30 Not Estab. %   Lymphs 60 Not Estab. %   Monocytes 6 Not Estab. %   Eos 3 Not Estab. %   Basos 1 Not Estab. %   Neutrophils Absolute 2.0 1.4 - 7.0 x10E3/uL   Lymphocytes Absolute 3.9 (H) 0.7 - 3.1 x10E3/uL   Monocytes Absolute 0.4 0.1 - 0.9 x10E3/uL   EOS (ABSOLUTE) 0.2 0.0 - 0.4 x10E3/uL   Basophils Absolute 0.1 0.0 - 0.2 x10E3/uL   Immature Granulocytes 0 Not Estab. %   Immature Grans (Abs) 0.0 0.0 - 0.1 x10E3/uL   Hematology Comments:  Note:        07/05/2023    8:03 AM 05/23/2023   12:02 PM 04/23/2023    2:06 PM 04/16/2023   10:31 AM 04/04/2023    8:24 AM  Depression screen PHQ 2/9  Decreased Interest 3 0 0 0 0  Down, Depressed, Hopeless 0 0 0 0 0  PHQ - 2 Score 3 0 0 0 0  Altered sleeping 0  0 0 0  Tired, decreased energy 0  0 0 0  Change in appetite 0  0 0 0  Feeling bad or failure about yourself  0  0 0 0  Trouble concentrating 0  0 0 0  Moving slowly or fidgety/restless 0  0 0 0  Suicidal thoughts 0  0 0 0  PHQ-9 Score 3  0 0 0  Difficult doing work/chores Not difficult at all  Not difficult at all Not difficult at all Not  difficult at all       07/05/2023    8:03 AM 04/23/2023    2:06 PM 04/04/2023    8:24 AM 01/04/2023    8:21 AM  GAD 7 : Generalized Anxiety Score  Nervous, Anxious, on Edge 0 0 0 0  Control/stop worrying 0 0 0 0  Worry too much - different things 0 0 0 0  Trouble relaxing 0 0 0 0  Restless 0 0 0 0  Easily annoyed or irritable 0 0 0 0  Afraid - awful might happen 0 0 0 0  Total GAD 7 Score 0 0 0 0  Anxiety Difficulty Not difficult at all Not difficult at all Not difficult at all Not difficult at all   The 10-year ASCVD risk score (Arnett DK, et al., 2019) is: 5.1%   Values used to calculate the score:     Age: 46 years     Clincally relevant sex: Female     Is Non-Hispanic African American: Yes     Diabetic: No     Tobacco smoker: Yes     Systolic Blood Pressure: 119 mmHg     Is BP treated: Yes     HDL Cholesterol: 65 mg/dL     Total Cholesterol: 141 mg/dL   Pertinent labs & imaging results that were available during my care of the patient were reviewed by me and considered in my medical decision making.  Assessment & Plan:  Darlette was seen today for medical management of chronic issues.  Diagnoses and all orders for this visit:  1. Hot flashes (Primary) Will start medication as below.  Discussed side effects.  Patient to call/message new provider in one month if she would like to continue medication.  - venlafaxine XR (EFFEXOR XR) 37.5 MG 24 hr capsule; Take 1 capsule (37.5 mg total) by mouth daily with breakfast.  Dispense: 30 capsule; Refill: 0  2. Primary hypertension Well controlled. Continue current regimen.   3. Mixed hyperlipidemia Stable at last labs. Continue diet and lifestyle modifications.  Reviewed ASCVD risk score.  Can continue pravastatin .   4. Prediabetes Continue diet and lifestyle modifications.  Stable at last labs. Will not repeat today.   5. Tobacco abuse Encouraged cessation. Praised patient on trying to decrease.   6. Vaginal  itching Labs as below. Will communicate results to patient once available. Will await results to determine next steps.  - Urinalysis, Routine w reflex microscopic - Urine Culture - WET PREP FOR TRICH, YEAST, CLUE - Herpes simplex virus culture  7. Shortness of breath  Referral placed as below.  - Ambulatory referral to Pulmonology   Continue all other maintenance medications.  Follow up plan: Return in about 3 months (around 10/05/2023) for Chronic Condition Follow up.   Continue healthy lifestyle choices, including diet (rich in fruits, vegetables, and lean proteins, and low in salt and simple carbohydrates) and exercise (at least 30 minutes of moderate physical activity daily).  Written and verbal instructions provided   The above assessment and management plan was discussed with the patient. The patient verbalized understanding of and has agreed to the management plan. Patient is aware to call the clinic if they develop any new symptoms or if symptoms persist or worsen. Patient is aware when to return to the clinic for a follow-up visit. Patient educated on when it is appropriate to go to the emergency department.   Marry Kins, DNP-FNP [facility] [address] [place], [place] [address] [phone]

## 2023-07-05 NOTE — Telephone Encounter (Signed)
 Copied from CRM [id number]. Topic: Clinical - Prescription Issue >> Jul 05, 2023 11:41 AM Laura Golden wrote: Reason for CRM: patient states that medicine that was supposed to be sent over to her pharmacist for her hot flashes was never sent. She is wanting to know what should she do? Patient is requesting a callback for either the provider or provider's nurse. Please contact patient at [phone]

## 2023-07-06 ENCOUNTER — Telehealth: Payer: Self-pay | Admitting: *Deleted

## 2023-07-06 ENCOUNTER — Ambulatory Visit: Payer: Self-pay | Admitting: Family Medicine

## 2023-07-06 LAB — URINE CULTURE

## 2023-07-06 NOTE — Patient Outreach (Signed)
  Care Management   Follow Up Note   07/06/2023 Name: Laura Golden MRN: [id number] DOB: Jan 29, 1965   Referred by: Cathlene Marry Lenis, FNP Reason for referral : Complex Care Management (RN Care Manager)   Received call requesting to reschedule because she out paying bills.   Follow Up Plan: Telephone follow up appointment with care management team member scheduled for:07-12-2023 at 9:00 am  Rosina Forte, BSN RN [facility], [facility] RN Care Manager Direct Dial: [phone]  Fax: [phone]

## 2023-07-09 LAB — HERPES SIMPLEX VIRUS CULTURE

## 2023-07-12 ENCOUNTER — Other Ambulatory Visit: Payer: Self-pay | Admitting: *Deleted

## 2023-07-12 NOTE — Patient Instructions (Signed)
 Visit Information  Laura Golden was given information about Medicaid Managed Care team care coordination services as a part of their Healthy [facility] Medicaid benefit. Janese Trachtenberg verbally consented to engagement with the [facility] Managed Care team.   If you are experiencing a medical emergency, please call 911 or report to your local emergency department or urgent care.   If you have a non-emergency medical problem during routine business hours, please contact your provider's office and ask to speak with a nurse.   For questions related to your Healthy [facility] health plan, please call: [phone] or visit the homepage here: [email or web address]  If you would like to schedule transportation through your Healthy [facility] plan, please call the following number at least 2 days in advance of your appointment: [phone]  For information about your ride after you set it up, call Ride Assist at [phone]. Use this number to activate a Will Call pickup, or if your transportation is late for a scheduled pickup. Use this number, too, if you need to make a change or cancel a previously scheduled reservation.  If you need transportation services right away, call [phone]. The after-hours call center is staffed 24 hours to handle ride assistance and urgent reservation requests (including discharges) 365 days a year. Urgent trips include sick visits, hospital discharge requests and life-sustaining treatment.  Call the [facility] Line at [phone], at any time, 24 hours a day, 7 days a week. If you are in danger or need immediate medical attention call 911.  If you would like help to quit smoking, call 1-800-QUIT-NOW (9091908756) OR Espaol: 1-855-Djelo-Ya (8-144-664-6430) o para ms informacin haga clic aqu or Text READY to 799-599 to register via text  Ms. Lynch - following are the goals we discussed in your visit today:   Goals  Addressed             This Visit's Progress    VBCI RN Care Plan: HLD   On track    Problems:  Chronic Disease Management support and education needs related to HLD Knowledge Deficits related to HLD  Goal: Over the next 90 days the Patient will attend all scheduled medical appointments: with primary care provider and specialist as evidenced by keeping all scheduled appointments        demonstrate Improved and Ongoing adherence to prescribed treatment plan for HLD as evidenced by consistent medication compliance, symptom monitoring, lifestyle changes take all medications exactly as prescribed and will call provider for medication related questions as evidenced by compliance with all medications    verbalize basic understanding of HLD disease process and self health management plan as evidenced by verbal explanation, recognizing symptoms, lifestyle modifications  Interventions:   Hyperlipidemia Interventions:  (Status:  Goal on track:  Yes.) Long Term Goal Medication review performed; medication list updated in electronic medical record.  Provider established cholesterol goals reviewed. At goal. Patient reports lifestyle modifications and continues with improvements Counseled on importance of regular laboratory monitoring as prescribed. Will need updated labs. Provided HLD educational materials Reviewed role and benefits of statin for ASCVD risk reduction. Reports compliance Pravastatin  Discussed strategies to manage statin-induced myalgias. Denies myalgias Reviewed importance of limiting foods high in cholesterol Reviewed exercise goals and target of 150 minutes per week. Reports walking daily for exercise Screening for signs and symptoms of depression related to chronic disease state Assessed social determinant of health barriers  Lab Results  Component Value Date   CHOL 141 07/06/2022   HDL 65 07/06/2022  LDLCALC 60 07/06/2022   TRIG 82 07/06/2022   CHOLHDL 2.2 07/06/2022      Patient Self-Care Activities:  Attend all scheduled provider appointments Call pharmacy for medication refills 3-7 days in advance of running out of medications Call provider office for new concerns or questions  Perform all self care activities independently  Perform IADL's (shopping, preparing meals, housekeeping, managing finances) independently Take medications as prescribed   take all medications exactly as prescribed call doctor with any symptoms you believe are related to your medicine call doctor when you experience any new symptoms go to all doctor appointments as scheduled develop an exercise routine  Plan:  Telephone follow up appointment with care management team member scheduled for:  08-13-2023 at 9:00 am     VBCI RN Care Plan: HTN   On track    Problems:  Chronic Disease Management support and education needs related to HTN Knowledge Deficits related to HTN  Goal: Over the next 90 days the Patient will attend all scheduled medical appointments: with primary care provider and specialist as evidenced by keeping all scheduled appointments        demonstrate Improved and Ongoing adherence to prescribed treatment plan for HTN as evidenced by consistent medication compliance, symptom monitoring, lifestyle changes take all medications exactly as prescribed and will call provider for medication related questions as evidenced by compliance with all medications    verbalize basic understanding of HTN disease process and self health management plan as evidenced by verbal explanation, recognizing symptoms, lifestyle modifications  Interventions:   Hypertension Interventions:  (Status:  Goal on track:  Yes.) Long Term Goal Last practice recorded BP readings:  BP Readings from Last 3 Encounters:  07/05/23 119/75  05/22/23 119/69  04/23/23 135/86   Most recent eGFR/CrCl:  Lab Results  Component Value Date   EGFR 74 04/04/2023    No components found for: CRCL  Evaluation  of current treatment plan related to hypertension self management and patient's adherence to plan as established by provider. Checking BP regularly and recording. Denies any chest pain, headache, dizziness or swelling at this time.  Provided education to patient re: stroke prevention, s/s of heart attack and stroke. Education and support provided. Reviewed medications with patient and discussed importance of compliance. Reports compliance with all medications Counseled on adverse effects of illicit drug and excessive alcohol use in patients with high blood pressure. Denies illicit drug or excessive alcohol use Counseled on the importance of exercise goals with target of 150 minutes per week. Walks approximately 3 times per week for around an hour Discussed plans with patient for ongoing care management follow up and provided patient with direct contact information for care management team Advised patient, providing education and rationale, to monitor blood pressure daily and record, calling PCP for findings outside established parameters. RNCM reviewed goal SBP<140 DBP<90 Reviewed scheduled/upcoming provider appointments including: 07-05-2023 with PCP Advised patient to discuss elevated home BP readings with provider Provided education on prescribed diet Heart Healthy Discussed complications of poorly controlled blood pressure such as heart disease, stroke, circulatory complications, vision complications, kidney impairment, sexual dysfunction Screening for signs and symptoms of depression related to chronic disease state  Assessed social determinant of health barriers  Patient Self-Care Activities:  Attend all scheduled provider appointments Call provider office for new concerns or questions  Perform all self care activities independently  Take medications as prescribed   check blood pressure daily write blood pressure results in a log or diary keep a blood  pressure log keep all doctor  appointments take medications for blood pressure exactly as prescribed  Plan:  Telephone follow up appointment with care management team member scheduled for:  08-13-2023 at 9:00 am        Please see education materials related to  provided by MyChart link.  Patient verbalizes understanding of instructions and care plan provided today and agrees to view in MyChart. Active MyChart status and patient understanding of how to access instructions and care plan via MyChart confirmed with patient.     Telephone follow up appointment with Managed Medicaid care management team member scheduled for:08-13-2023 at 9:00 am  Rosina Forte, BSN RN [facility], [facility] RN Care Manager Direct Dial: [phone]  Fax: [phone]   Following is a copy of your plan of care:  There are no care plans that you recently modified to display for this patient.

## 2023-07-19 ENCOUNTER — Ambulatory Visit: Payer: Self-pay

## 2023-07-19 NOTE — Telephone Encounter (Signed)
 FYI Only or Action Required?: Action required by provider: requesting recommendations from provider.  Patient was last seen in primary care on 07/05/2023 by Cathlene Marry Lenis, FNP.  Called Nurse Triage reporting Dysuria.  Symptoms began about a month ago.  Interventions attempted: Rest, hydration, or home remedies.  Symptoms are: unchanged.  Triage Disposition: See Physician Within 24 Hours-asking for recommendations from provider.  Patient/caregiver understands and will follow disposition?: No, wishes to speak with PCP  Copied from CRM [id number]. Topic: Clinical - Red Word Triage >> Jul 19, 2023  4:32 PM Mercer PEDLAR wrote: Red Word that prompted transfer to Nurse Triage: UTI results were negative but still having itching and burning. Reason for Disposition  Urinating more frequently than usual (i.e., frequency) OR new-onset of the feeling of an urgent need to urinate (i.e., urgency)  Answer Assessment - Initial Assessment Questions 1. SYMPTOM: What's the main symptom you're concerned about? (e.g., frequency, incontinence)     Itching and burning with urination 2. ONSET: When did the  itching and burning with urination  start?     Last month 3. PAIN: Is there any pain? If Yes, ask: How bad is it? (Scale: 1-10; mild, moderate, severe)     mild 4. CAUSE: What do you think is causing the symptoms?     Patient is concerned for UTI 5. OTHER SYMPTOMS: Do you have any other symptoms? (e.g., blood in urine, fever, flank pain, pain with urination)     Pain with urination  Protocols used: Urinary Symptoms-A-AH

## 2023-07-20 NOTE — Telephone Encounter (Signed)
 Lmtcb please schedule pt for reevaluation if she calls back. LS

## 2023-07-26 ENCOUNTER — Other Ambulatory Visit: Payer: Self-pay | Admitting: Family Medicine

## 2023-07-26 DIAGNOSIS — Z1231 Encounter for screening mammogram for malignant neoplasm of breast: Secondary | ICD-10-CM

## 2023-07-30 ENCOUNTER — Telehealth (HOSPITAL_BASED_OUTPATIENT_CLINIC_OR_DEPARTMENT_OTHER): Payer: Self-pay

## 2023-07-30 NOTE — Telephone Encounter (Signed)
 Copied from CRM [id number]. Topic: Clinical - Medication Question >> Jul 30, 2023 11:59 AM Rozanna MATSU wrote: Reason for CRM: PT CALLING ABOUT A LETTER SHE RECEIVED LAST YEAR ABOUT CT SCAN AND POSSIBLE LUNG SCREENING. PT REALLY NOT SURE WHAT SHE IS SUPPOSE TO BE HAVING BUT STATED [place] TO HAVE IT DONE. PLEASE REACH OUT TO PT ABOUT THIS

## 2023-07-30 NOTE — Telephone Encounter (Signed)
 Returned call. LVM. No letter recently sent from the [employer] program.

## 2023-08-13 ENCOUNTER — Other Ambulatory Visit: Payer: Self-pay | Admitting: *Deleted

## 2023-08-13 NOTE — Patient Outreach (Signed)
 Complex Care Management   Visit Note  08/13/2023  Name:  Laura Golden MRN: [id number] DOB: Aug 28, 1965  Situation: Referral received for Complex Care Management related to HTN and HLD I obtained verbal consent from Patient.  Visit completed with patient  on the phone  Background:   Past Medical History:  Diagnosis Date   Hypertension    Mixed hyperlipidemia 04/20/2020    Assessment: Patient Reported Symptoms:  Cognitive Cognitive Status: No symptoms reported Cognitive/Intellectual Conditions Management [RPT]: None reported or documented in medical history or problem list   Health Maintenance Behaviors: Annual physical exam, Exercise Healing Pattern: Average Health Facilitated by: Rest, Prayer/meditation, Stress management  Neurological Neurological Review of Symptoms: No symptoms reported Neurological Management Strategies: Routine screening Neurological Self-Management Outcome: 4 (good)  HEENT HEENT Symptoms Reported: No symptoms reported HEENT Management Strategies: Coping strategies HEENT Self-Management Outcome: 4 (good)    Cardiovascular Cardiovascular Symptoms Reported: No symptoms reported Does patient have uncontrolled Hypertension?: No Cardiovascular Management Strategies: Routine screening, Medication therapy Cardiovascular Self-Management Outcome: 4 (good)  Respiratory Respiratory Symptoms Reported: No symptoms reported Respiratory Management Strategies: Routine screening Respiratory Self-Management Outcome: 4 (good)  Endocrine Endocrine Symptoms Reported: No symptoms reported Is patient diabetic?: No Endocrine Self-Management Outcome: 4 (good)  Gastrointestinal Gastrointestinal Symptoms Reported: No symptoms reported Gastrointestinal Self-Management Outcome: 4 (good) Nutrition Risk Screen (CP): No indicators present  Genitourinary Genitourinary Symptoms Reported: Itching or irritation, Pain/burning with urination Genitourinary Self-Management Outcome: 3  (uncertain)  Integumentary Integumentary Symptoms Reported: No symptoms reported Skin Management Strategies: Routine screening Skin Self-Management Outcome: 4 (good)  Musculoskeletal Musculoskelatal Symptoms Reviewed: Other Other Musculoskeletal Symptoms: reports shoulder pain bursitits Additional Musculoskeletal Details: Reports difficulty with walking due to calluses forming on feet. Reports needing new shoes Musculoskeletal Management Strategies: Routine screening Musculoskeletal Self-Management Outcome: 3 (uncertain) Falls in the past year?: Yes Number of falls in past year: 1 or less Was there an injury with Fall?: Yes Fall Risk Category Calculator: 2 Patient Fall Risk Level: Moderate Fall Risk Patient at Risk for Falls Due to: History of fall(s) Fall risk Follow up: Falls evaluation completed, Education provided  Psychosocial Psychosocial Symptoms Reported: No symptoms reported Behavioral Health Self-Management Outcome: 4 (good) Major Change/Loss/Stressor/Fears (CP): Denies Techniques to Cope with Loss/Stress/Change: None Quality of Family Relationships: helpful, involved, supportive Do you feel physically threatened by others?: No      08/13/2023    9:17 AM  Depression screen PHQ 2/9  Decreased Interest 0  Down, Depressed, Hopeless 0  PHQ - 2 Score 0    There were no vitals filed for this visit.  Medications Reviewed Today     Reviewed by Bertrum Rosina HERO, RN (Registered Nurse) on 08/13/23 at [phone]  Med List Status: <None>   Medication Order Taking? Sig Documenting Provider Last Dose Status Informant  acetaminophen (TYLENOL) 325 MG tablet [id number] Yes Take 325 mg by mouth every 6 (six) hours as needed for mild pain or headache. [provider]  Active Self  albuterol  (VENTOLIN  HFA) 108 (90 Base) MCG/ACT inhaler [id number] Yes Inhale 2 puffs into the lungs every 6 (six) hours as needed for wheezing or shortness of breath. Gladis, Mary-Margaret, FNP  Active    amLODipine  (NORVASC ) 5 MG tablet [id number] Yes Take 1 tablet (5 mg total) by mouth daily. Cathlene Marry Lenis, FNP  Active   fluticasone  (FLONASE ) 50 MCG/ACT nasal spray [id number] Yes Place 2 sprays into both nostrils daily. Cathlene Marry Lenis, FNP  Active   pravastatin  (PRAVACHOL ) 40 MG tablet [id number] Yes Take  1 tablet (40 mg total) by mouth every evening. Cathlene Marry Lenis, FNP  Active   triamcinolone  cream (KENALOG ) 0.1 % [id number]  Apply 1 application. topically 2 (two) times daily.  Patient not taking: Reported on 08/13/2023   Joyce, Britney F, FNP  Active   venlafaxine  XR (EFFEXOR  XR) 37.5 MG 24 hr capsule [id number] Yes Take 1 capsule (37.5 mg total) by mouth daily with breakfast. Cathlene, Marry Lenis, FNP  Active             Recommendation:   Continue Current Plan of Care Schedule eye appointment & Podiatry appointment for callus removal  Follow Up Plan:   Telephone follow-up in 1 month  Rosina Forte, BSN RN [facility], [facility] RN Care Manager Direct Dial: [phone]  Fax: [phone]

## 2023-08-13 NOTE — Patient Instructions (Signed)
 Visit Information  [name] was given information about Medicaid Managed Care team care coordination services as a part of their Healthy [facility] Medicaid benefit. Laura Golden   If you would like to schedule transportation through your Healthy [facility] plan, please call the following number at least 2 days in advance of your appointment: [phone]  For information about your ride after you set it up, call Ride Assist at [phone]. Use this number to activate a Will Call pickup, or if your transportation is late for a scheduled pickup. Use this number, too, if you need to make a change or cancel a previously scheduled reservation.  If you need transportation services right away, call [phone]. The after-hours call center is staffed 24 hours to handle ride assistance and urgent reservation requests (including discharges) 365 days a year. Urgent trips include sick visits, hospital discharge requests and life-sustaining treatment.  Call the [facility] Line at [phone], at any time, 24 hours a day, 7 days a week. If you are in danger or need immediate medical attention call 911.   [name] - following are the goals we discussed in your visit today:   Goals Addressed             This Visit's Progress    VBCI RN Care Plan: HLD   On track    Problems:  Chronic Disease Management support and education needs related to HLD Knowledge Deficits related to HLD  Goal: Over the next 90 days the Patient will attend all scheduled medical appointments: with primary care provider and specialist as evidenced by keeping all scheduled appointments        demonstrate Improved and Ongoing adherence to prescribed treatment plan for HLD as evidenced by consistent medication compliance, symptom monitoring, lifestyle changes take all medications exactly as prescribed and will call provider for medication related questions as evidenced by compliance with all medications    verbalize  basic understanding of HLD disease process and self health management plan as evidenced by verbal explanation, recognizing symptoms, lifestyle modifications  Interventions:   Hyperlipidemia Interventions:  (Status:  Goal on track:  Yes.) Long Term Goal Medication review performed; medication list updated in electronic medical record.  Provider established cholesterol goals reviewed. At goal. Patient reports lifestyle modifications and continues with improvements Counseled on importance of regular laboratory monitoring as prescribed. Will need updated labs. Provided HLD educational materials Reviewed role and benefits of statin for ASCVD risk reduction. Reports compliance Pravastatin  Discussed strategies to manage statin-induced myalgias. Denies myalgias Reviewed importance of limiting foods high in cholesterol Reviewed exercise goals and target of 150 minutes per week. Reports that she has been unable to walk due to calluses on her feet and states that she does need some new shoes. RNCM to consult with BSW to see if there are available resources for patient Screening for signs and symptoms of depression related to chronic disease state Assessed social determinant of health barriers  Lab Results  Component Value Date   CHOL 141 07/06/2022   HDL 65 07/06/2022   LDLCALC 60 07/06/2022   TRIG 82 07/06/2022   CHOLHDL 2.2 07/06/2022     Patient Self-Care Activities:  Attend all scheduled provider appointments Call pharmacy for medication refills 3-7 days in advance of running out of medications Call provider office for new concerns or questions  Perform all self care activities independently  Perform IADL's (shopping, preparing meals, housekeeping, managing finances) independently Take medications as prescribed   take all medications exactly  as prescribed call doctor with any symptoms you believe are related to your medicine call doctor when you experience any new symptoms go to all doctor  appointments as scheduled develop an exercise routine  Plan:  Telephone follow up appointment with care management team member scheduled for:  09-12-2023 at 9:30 am     VBCI RN Care Plan: HTN   On track    Problems:  Chronic Disease Management support and education needs related to HTN Knowledge Deficits related to HTN  Goal: Over the next 90 days the Patient will attend all scheduled medical appointments: with primary care provider and specialist as evidenced by keeping all scheduled appointments        demonstrate Improved and Ongoing adherence to prescribed treatment plan for HTN as evidenced by consistent medication compliance, symptom monitoring, lifestyle changes take all medications exactly as prescribed and will call provider for medication related questions as evidenced by compliance with all medications    verbalize basic understanding of HTN disease process and self health management plan as evidenced by verbal explanation, recognizing symptoms, lifestyle modifications  Interventions:   Hypertension Interventions:  (Status:  Goal on track:  Yes.) Long Term Goal Last practice recorded BP readings:  BP Readings from Last 3 Encounters:  07/05/23 119/75  05/22/23 119/69  04/23/23 135/86   Most recent eGFR/CrCl:  Lab Results  Component Value Date   EGFR 74 04/04/2023    No components found for: CRCL  Evaluation of current treatment plan related to hypertension self management and patient's adherence to plan as established by provider. Reports that she has not been checking her BP lately because she has not been experiencing any headaches. RNCM advised that it is still best practice to monitor and record. Provided education to patient re: stroke prevention, s/s of heart attack and stroke. Education and support provided. Reviewed medications with patient and discussed importance of compliance. Reports compliance with all medications Counseled on adverse effects of illicit drug  and excessive alcohol use in patients with high blood pressure. Denies illicit drug or excessive alcohol use Counseled on the importance of exercise goals with target of 150 minutes per week. Reports that she has been unable to walk due to calluses on her feet and states that she does need some new shoes. RNCM to consult with BSW to see if there are available resources for patient Discussed plans with patient for ongoing care management follow up and provided patient with direct contact information for care management team Advised patient, providing education and rationale, to monitor blood pressure daily and record, calling PCP for findings outside established parameters. RNCM reviewed goal SBP<140 DBP<90 Reviewed scheduled/upcoming provider appointments including: 10-08-2023 with PCP Advised patient to discuss elevated home BP readings with provider Provided education on prescribed diet Heart Healthy Discussed complications of poorly controlled blood pressure such as heart disease, stroke, circulatory complications, vision complications, kidney impairment, sexual dysfunction Screening for signs and symptoms of depression related to chronic disease state  Assessed social determinant of health barriers  Patient Self-Care Activities:  Attend all scheduled provider appointments Call provider office for new concerns or questions  Perform all self care activities independently  Take medications as prescribed   check blood pressure daily write blood pressure results in a log or diary keep a blood pressure log keep all doctor appointments take medications for blood pressure exactly as prescribed  Plan:  Telephone follow up appointment with care management team member scheduled for:  09-12-2023 at 9:30 am  Please see education materials related to foot care provided by MyChart link.  Patient verbalizes understanding of instructions and care plan provided today and agrees to view in  MyChart. Active MyChart status and patient understanding of how to access instructions and care plan via MyChart confirmed with patient.     Telephone follow up appointment with Managed Medicaid care management team member scheduled for:09-12-2023 at 9:30 am  Rosina Forte, BSN RN [facility], [facility] RN Care Manager Direct Dial: [phone]  Fax: [phone]   Following is a copy of your plan of care:  There are no care plans that you recently modified to display for this patient.

## 2023-08-14 ENCOUNTER — Other Ambulatory Visit: Payer: Self-pay | Admitting: *Deleted

## 2023-08-14 DIAGNOSIS — R232 Flushing: Secondary | ICD-10-CM

## 2023-08-14 MED ORDER — VENLAFAXINE HCL ER 37.5 MG PO CP24: 37.5000 mg | ORAL_CAPSULE | Freq: Every day | ORAL | 1 refills | Status: DC

## 2023-08-20 ENCOUNTER — Ambulatory Visit
Admission: RE | Admit: 2023-08-20 | Discharge: 2023-08-20 | Disposition: A | Discharge status: Home / Self Care | Source: Ambulatory Visit | Attending: Family Medicine

## 2023-08-20 DIAGNOSIS — Z1231 Encounter for screening mammogram for malignant neoplasm of breast: Secondary | ICD-10-CM

## 2023-09-12 ENCOUNTER — Other Ambulatory Visit: Payer: Self-pay | Admitting: *Deleted

## 2023-09-12 NOTE — Patient Outreach (Signed)
 Complex Care Management   Visit Note  09/12/2023  Name:  Laura Golden MRN: [id number] DOB: Jun 04, 1965  Situation: Referral received for Complex Care Management related to HTN, HLD I obtained verbal consent from Patient.  Visit completed with Patient  on the phone  Background:   Past Medical History:  Diagnosis Date   Hypertension    Mixed hyperlipidemia 04/20/2020    Assessment: Patient Reported Symptoms:  Cognitive Cognitive Status: No symptoms reported Cognitive/Intellectual Conditions Management [RPT]: None reported or documented in medical history or problem list   Health Maintenance Behaviors: Annual physical exam Healing Pattern: Average Health Facilitated by: Rest  Neurological Neurological Review of Symptoms: No symptoms reported Neurological Self-Management Outcome: 4 (good)  HEENT HEENT Symptoms Reported: No symptoms reported HEENT Self-Management Outcome: 4 (good)    Cardiovascular Cardiovascular Symptoms Reported: No symptoms reported Does patient have uncontrolled Hypertension?: No Cardiovascular Management Strategies: Medication therapy Cardiovascular Self-Management Outcome: 4 (good)  Respiratory Respiratory Symptoms Reported: No symptoms reported Respiratory Management Strategies: Routine screening Respiratory Self-Management Outcome: 4 (good)  Endocrine Endocrine Symptoms Reported: No symptoms reported Is patient diabetic?: No Endocrine Self-Management Outcome: 4 (good)  Gastrointestinal Gastrointestinal Symptoms Reported: No symptoms reported Gastrointestinal Self-Management Outcome: 4 (good) Nutrition Risk Screen (CP): Difficulty chewing/swallowing  Genitourinary Genitourinary Symptoms Reported: No symptoms reported Genitourinary Self-Management Outcome: 4 (good)  Integumentary Integumentary Symptoms Reported: No symptoms reported Skin Self-Management Outcome: 4 (good)  Musculoskeletal Musculoskelatal Symptoms Reviewed: Difficulty walking Additional  Musculoskeletal Details: difficulty walking due to calluses Musculoskeletal Self-Management Outcome: 3 (uncertain) Falls in the past year?: Yes Number of falls in past year: 1 or less Was there an injury with Fall?: Yes Fall Risk Category Calculator: 2 Patient Fall Risk Level: Moderate Fall Risk Patient at Risk for Falls Due to: History of fall(s) Fall risk Follow up: Falls evaluation completed, Education provided  Psychosocial Psychosocial Symptoms Reported: No symptoms reported Behavioral Health Self-Management Outcome: 4 (good) Major Change/Loss/Stressor/Fears (CP): Denies Techniques to Cope with Loss/Stress/Change: Not applicable Quality of Family Relationships: helpful, involved, supportive Do you feel physically threatened by others?: No    09/12/2023    PHQ2-9 Depression Screening   Little interest or pleasure in doing things Not at all  Feeling down, depressed, or hopeless Not at all  PHQ-2 - Total Score 0  Trouble falling or staying asleep, or sleeping too much    Feeling tired or having little energy    Poor appetite or overeating     Feeling bad about yourself - or that you are a failure or have let yourself or your family down    Trouble concentrating on things, such as reading the newspaper or watching television    Moving or speaking so slowly that other people could have noticed.  Or the opposite - being so fidgety or restless that you have been moving around a lot more than usual    Thoughts that you would be better off dead, or hurting yourself in some way    PHQ2-9 Total Score    If you checked off any problems, how difficult have these problems made it for you to do your work, take care of things at home, or get along with other people    Depression Interventions/Treatment      There were no vitals filed for this visit.  Medications Reviewed Today     Reviewed by Bertrum Rosina HERO, RN (Registered Nurse) on 09/12/23 at 0935  Med List Status: <None>   Medication  Order Taking? Sig Documenting Provider Last  Dose Status Informant  acetaminophen (TYLENOL) 325 MG tablet [id number] Yes Take 325 mg by mouth every 6 (six) hours as needed for mild pain or headache. [provider]  Active Self  albuterol  (VENTOLIN  HFA) 108 (90 Base) MCG/ACT inhaler [id number] Yes Inhale 2 puffs into the lungs every 6 (six) hours as needed for wheezing or shortness of breath. Gladis, Mary-Margaret, FNP  Active   amLODipine  (NORVASC ) 5 MG tablet [id number] Yes Take 1 tablet (5 mg total) by mouth daily. Cathlene Marry Lenis, FNP  Active   fluticasone  (FLONASE ) 50 MCG/ACT nasal spray [id number] Yes Place 2 sprays into both nostrils daily. Cathlene Marry Lenis, FNP  Active   pravastatin  (PRAVACHOL ) 40 MG tablet [id number] Yes Take 1 tablet (40 mg total) by mouth every evening. Cathlene Marry Lenis, FNP  Active   triamcinolone  cream (KENALOG ) 0.1 % [id number] Yes Apply 1 application. topically 2 (two) times daily. Merlynn Niki FALCON, FNP  Consider Medication Status and Discontinue   venlafaxine  XR (EFFEXOR  XR) 37.5 MG 24 hr capsule [id number] Yes Take 1 capsule (37.5 mg total) by mouth daily with breakfast. Deitra Morton Hummer, Nena, NP  Active             Recommendation:   Continue Current Plan of Care  Follow Up Plan:   Telephone follow-up in 1 month  Rosina Forte, BSN RN [facility], [facility] RN Care Manager Direct Dial: [phone]  Fax: [phone]

## 2023-09-12 NOTE — Patient Instructions (Signed)
 Visit Information  Ms. Byrns was given information about Medicaid Managed Care team care coordination services as a part of their Healthy [facility] Medicaid benefit. Etty Cacciola   If you would like to schedule transportation through your Healthy [facility] plan, please call the following number at least 2 days in advance of your appointment: [phone]  For information about your ride after you set it up, call Ride Assist at [phone]. Use this number to activate a Will Call pickup, or if your transportation is late for a scheduled pickup. Use this number, too, if you need to make a change or cancel a previously scheduled reservation.  If you need transportation services right away, call [phone]. The after-hours call center is staffed 24 hours to handle ride assistance and urgent reservation requests (including discharges) 365 days a year. Urgent trips include sick visits, hospital discharge requests and life-sustaining treatment.  Call the [facility] Line at [phone], at any time, 24 hours a day, 7 days a week. If you are in danger or need immediate medical attention call 911.   Ms. Alessandrini - following are the goals we discussed in your visit today:   Goals Addressed             This Visit's Progress    VBCI RN Care Plan: HLD   On track    Problems:  Chronic Disease Management support and education needs related to HLD Knowledge Deficits related to HLD  Goal: Over the next 90 days the Patient will attend all scheduled medical appointments: with primary care provider and specialist as evidenced by keeping all scheduled appointments        demonstrate Improved and Ongoing adherence to prescribed treatment plan for HLD as evidenced by consistent medication compliance, symptom monitoring, lifestyle changes take all medications exactly as prescribed and will call provider for medication related questions as evidenced by compliance with all medications    verbalize  basic understanding of HLD disease process and self health management plan as evidenced by verbal explanation, recognizing symptoms, lifestyle modifications  Interventions:   Hyperlipidemia Interventions:  (Status:  Goal on track:  Yes.) Long Term Goal Medication review performed; medication list updated in electronic medical record.  Provider established cholesterol goals reviewed. At goal. Patient reports lifestyle modifications and continues with improvements Counseled on importance of regular laboratory monitoring as prescribed. Will need updated labs. Provided HLD educational materials Reviewed role and benefits of statin for ASCVD risk reduction. Reports compliance Pravastatin  Discussed strategies to manage statin-induced myalgias. Denies myalgias Reviewed importance of limiting foods high in cholesterol Reviewed exercise goals and target of 150 minutes per week.  Screening for signs and symptoms of depression related to chronic disease state Assessed social determinant of health barriers  Lab Results  Component Value Date   CHOL 141 07/06/2022   HDL 65 07/06/2022   LDLCALC 60 07/06/2022   TRIG 82 07/06/2022   CHOLHDL 2.2 07/06/2022     Patient Self-Care Activities:  Attend all scheduled provider appointments Call pharmacy for medication refills 3-7 days in advance of running out of medications Call provider office for new concerns or questions  Perform all self care activities independently  Perform IADL's (shopping, preparing meals, housekeeping, managing finances) independently Take medications as prescribed   take all medications exactly as prescribed call doctor with any symptoms you believe are related to your medicine call doctor when you experience any new symptoms go to all doctor appointments as scheduled develop an exercise routine  Plan:  Telephone follow up appointment with care management team member scheduled for:  10-12-2023 at 10:00 am     VBCI RN Care  Plan: HTN   On track    Problems:  Chronic Disease Management support and education needs related to HTN Knowledge Deficits related to HTN  Goal: Over the next 90 days the Patient will attend all scheduled medical appointments: with primary care provider and specialist as evidenced by keeping all scheduled appointments        demonstrate Improved and Ongoing adherence to prescribed treatment plan for HTN as evidenced by consistent medication compliance, symptom monitoring, lifestyle changes take all medications exactly as prescribed and will call provider for medication related questions as evidenced by compliance with all medications    verbalize basic understanding of HTN disease process and self health management plan as evidenced by verbal explanation, recognizing symptoms, lifestyle modifications  Interventions:   Hypertension Interventions:  (Status:  Goal on track:  Yes.) Long Term Goal Last practice recorded BP readings:  BP Readings from Last 3 Encounters:  07/05/23 119/75  05/22/23 119/69  04/23/23 135/86   Most recent eGFR/CrCl:  Lab Results  Component Value Date   EGFR 74 04/04/2023    No components found for: CRCL  Evaluation of current treatment plan related to hypertension self management and patient's adherence to plan as established by provider. Reports that she has not been checking her BP lately because she has not been experiencing any headaches. RNCM advised that it is still best practice to monitor and record. Provided education to patient re: stroke prevention, s/s of heart attack and stroke. Education and support provided. Reviewed medications with patient and discussed importance of compliance. Reports compliance with all medications Counseled on adverse effects of illicit drug and excessive alcohol use in patients with high blood pressure. Denies illicit drug or excessive alcohol use Counseled on the importance of exercise goals with target of 150 minutes per  week. Reports that she has been unable to walk due to calluses on her feet and states that she does need some new shoes. RNCM advised to schedule with podiatry and to contact [employer] for shoes. Discussed plans with patient for ongoing care management follow up and provided patient with direct contact information for care management team Advised patient, providing education and rationale, to monitor blood pressure daily and record, calling PCP for findings outside established parameters. RNCM reviewed goal SBP<140 DBP<90 Reviewed scheduled/upcoming provider appointments including: 10-08-2023 with PCP Advised patient to discuss elevated home BP readings with provider Provided education on prescribed diet Heart Healthy Discussed complications of poorly controlled blood pressure such as heart disease, stroke, circulatory complications, vision complications, kidney impairment, sexual dysfunction Screening for signs and symptoms of depression related to chronic disease state  Assessed social determinant of health barriers  Patient Self-Care Activities:  Attend all scheduled provider appointments Call provider office for new concerns or questions  Perform all self care activities independently  Take medications as prescribed   check blood pressure daily write blood pressure results in a log or diary keep a blood pressure log keep all doctor appointments take medications for blood pressure exactly as prescribed  Plan:  Telephone follow up appointment with care management team member scheduled for:  10-12-2023 at 10: 00 am        Please see education materials related to HTN provided by MyChart link.  Patient verbalizes understanding of instructions and care plan provided today and agrees to view in MyChart. Active MyChart status  and patient understanding of how to access instructions and care plan via MyChart confirmed with patient.     Telephone follow up appointment with Managed  Medicaid care management team member scheduled for:10-12-2023 at 10:00 am  Rosina Forte, BSN RN [facility], [facility] RN Care Manager Direct Dial: [phone]  Fax: [phone]   Following is a copy of your plan of care:  There are no care plans that you recently modified to display for this patient.

## 2023-09-18 NOTE — Progress Notes (Unsigned)
 [facility], female    DOB: 1965-01-18    MRN: [id number]   Brief patient profile:  58  yobf  active smoker  referred to pulmonary clinic in [place]  09/19/2023 by KANDICE Lenis NP [place]  for doe and cough x  2 years (with h/o remote ACEi cough)   Pt not previously seen by PCCM service.     History of Present Illness  09/19/2023  Pulmonary/ 1st office eval/ Floriene Jeschke / [place] [facility]  Chief Complaint  Patient presents with   Establish Care   Cough    Shob at night and with activity - cough (Thick mucus - congestion )   Dyspnea:  [employer] across the parking lot / slower pace/ whole store  Cough: white mucus mostly daytime quite violent at times with gen bilateral cp just with hard cough  Sleep: flat bed  2 pillows and no noct or am  symptoms  SABA use: avg 1 / week not helping  but hfa very poor (see below)  02: none  LDSCT:referred today  No obvious day to day or daytime pattern/variability or assoc   purulent sputum or mucus plugs or hemoptysis or  chest tightness, subjective wheeze or overt sinus or hb symptoms.    Also denies any obvious fluctuation of symptoms with weather or environmental changes or other aggravating or alleviating factors except as outlined above   No unusual exposure hx or h/o childhood pna/ asthma or knowledge of premature birth.  Current Allergies, Complete Past Medical History, Past Surgical History, Family History, and Social History were reviewed in [employer] record.  ROS  The following are not active complaints unless bolded Hoarseness, sore throat, dysphagia, dental problems, itching, sneezing,  nasal congestion or discharge of excess mucus or purulent secretions, ear ache,   fever, chills, sweats, unintended wt loss or wt gain, classically pleuritic or exertional cp,  orthopnea pnd or arm/hand swelling  or leg swelling, presyncope, palpitations, abdominal pain, anorexia, nausea, vomiting, diarrhea  or change in bowel  habits or change in bladder habits, change in stools or change in urine, dysuria, hematuria,  rash, arthralgias, visual complaints, headache, numbness, weakness or ataxia or problems with walking or coordination,  change in mood or  memory.            Outpatient Medications Prior to Visit  Medication Sig Dispense Refill   acetaminophen (TYLENOL) 325 MG tablet Take 325 mg by mouth every 6 (six) hours as needed for mild pain or headache.     albuterol  (VENTOLIN  HFA) 108 (90 Base) MCG/ACT inhaler Inhale 2 puffs into the lungs every 6 (six) hours as needed for wheezing or shortness of breath. 8 g 0   amLODipine  (NORVASC ) 5 MG tablet Take 1 tablet (5 mg total) by mouth daily. 90 tablet 0   fluticasone  (FLONASE ) 50 MCG/ACT nasal spray Place 2 sprays into both nostrils daily. 16 g 6   pravastatin  (PRAVACHOL ) 40 MG tablet Take 1 tablet (40 mg total) by mouth every evening. 90 tablet 1   triamcinolone  cream (KENALOG ) 0.1 % Apply 1 application. topically 2 (two) times daily. 80 g 1   venlafaxine  XR (EFFEXOR  XR) 37.5 MG 24 hr capsule Take 1 capsule (37.5 mg total) by mouth daily with breakfast. 30 capsule 1   No facility-administered medications prior to visit.    Past Medical History:  Diagnosis Date   Hypertension    Mixed hyperlipidemia 04/20/2020      Objective:  BP 120/85   Pulse 70   Ht 4' 11 (1.499 m)   Wt 98 lb 3.2 oz (44.5 kg)   SpO2 97% Comment: ra  BMI 19.83 kg/m   SpO2: 97 % (ra) chronically ill appearing     HEENT : Oropharynx  no top teeth, very poor lower dentition   Nasal turbinates nl    NECK :  without  apparent JVD/ palpable Nodes/TM    LUNGS: no acc muscle use,  Min barrel  contour chest wall with bilateral  slightly decreased bs s audible wheeze and  without cough on insp or exp maneuvers and min  Hyperresonant  to  percussion bilaterally    CV:  RRR  no s3 or murmur or increase in P2, and no edema   ABD:  soft and nontender with pos end  insp Hoover's   in the supine position.  No bruits or organomegaly appreciated   MS:  Nl gait/ ext warm without deformities Or obvious joint restrictions  calf tenderness, cyanosis or clubbing     SKIN: warm and dry without lesions    NEURO:  alert, approp, nl sensorium with  no motor or cerebellar deficits apparent.        CXR PA and Lateral:   09/19/2023 :    I personally reviewed images and impression is as follows:         Assessment   Assessment & Plan Asthmatic bronchitis , chronic (HCC) Active smoker - 09/19/2023  After extensive coaching inhaler device,  effectiveness =    0% (never inhaled anything at all)  - acute flare since 09/18/23  with mscp from coughing  >>>  rx zpak/ pred x 6 days/ gerd rx short term and tramadol  50 mg q 4 h prn    Clinically despite smoking she does not have much in terms of COPD at this point but rather intermittent bouts of AB  and unable to teach how to use hfa today  with DPIs likely to make cough worse as I suspect a component of UACS  Upper airway cough syndrome (previously labeled PNDS),  is so named because it's frequently impossible to sort out how much is  CR/sinusitis with freq throat clearing (which can be related to primary GERD)   vs  causing  secondary ( extra esophageal)  GERD from wide swings in gastric pressure that occur with throat clearing, often  promoting self use of mint and menthol lozenges that reduce the lower esophageal sphincter tone and exacerbate the problem further in a cyclical fashion.   These are the same pts (now being labeled as having irritable larynx syndrome by some cough centers) who not infrequently have a history of having failed to tolerate ace inhibitors,  dry powder inhalers or biphosphonates or report having atypical/extraesophageal reflux symptoms (LPR) that don't respond to standard doses of PPI  and are easily confused as having aecopd or asthma flares by even experienced allergists/ pulmonologists (myself included).    Rec; >>>Rx as above with tradmadol and gerd rx to stop cylical cough from UACS component >>> F/u in 6 weeks with all meds in hand using a trust but verify approach to confirm accurate Medication  Reconciliation The principal here is that until we are certain that the  patients are doing what we've asked, it makes no sense to ask them to do more.   Cigarette smoker Active smoker > referred 09/19/2023 to [facility]   Low-dose CT lung cancer screening is recommended for patients  who are 1-43 years of age with a 20+ pack-year history of smoking and who are currently smoking or quit <=15 years ago. No coughing up blood  No unintentional weight loss of > 15 pounds in the last 6 months - pt is eligible for scanning yearly until 35 y  after quits > referred   Counseled re importance of smoking cessation but did not meet time criteria for separate billing            Each maintenance medication was reviewed in detail including emphasizing most importantly the difference between maintenance and prns and under what circumstances the prns are to be triggered using an action plan format where appropriate.  Total time for H and P, chart review, counseling, reviewing hfa  device(s) and generating customized AVS unique to this office visit / same day charting = 45 min new pt eval          AVS  Patient Instructions  Please remember to go to the  x-ray department  @  [facility] for your tests - we will call you with the results when they are available     My office will be contacting you by phone for referral to lung cancer screening   [phone] - if you don't hear back from my office within one week,  please call us  back or notify us  thru MyChart and we'll address it right away.    For cough : Zpak   Prednisone  10 mg take  4 each am x 2 days,   2 each am x 2 days,  1 each am x 2 days and stop   For cough > Robitussin DM  (over the counter) and supplement with Tramadol  50 mg up to  every 4 hours as needed for pain or cough   Omeprazole  20 mg   Take  30-60 min before first meal of the day and Pepcid  (famotidine )  20 mg after supper until return to office - this is the best way to tell whether stomach acid is contributing to your problem.    The key is to stop smoking completely before smoking completely stops you!     Please schedule a follow up office visit in 6 weeks, call sooner if needed with all medications /inhalers/ solutions in hand so we can verify exactly what you are taking. This includes all medications from all doctors and over the counters      Ozell America, MD 09/19/2023

## 2023-09-19 ENCOUNTER — Ambulatory Visit (INDEPENDENT_AMBULATORY_CARE_PROVIDER_SITE_OTHER): Admitting: Internal Medicine

## 2023-09-19 ENCOUNTER — Encounter: Payer: Self-pay | Admitting: Internal Medicine

## 2023-09-19 ENCOUNTER — Ambulatory Visit (HOSPITAL_COMMUNITY)
Admission: RE | Admit: 2023-09-19 | Discharge: 2023-09-19 | Disposition: A | Discharge status: Home / Self Care | Source: Ambulatory Visit | Attending: Internal Medicine | Admitting: Internal Medicine

## 2023-09-19 VITALS — BP 120/85 | HR 70 | Ht 59.0 in | Wt 98.2 lb

## 2023-09-19 DIAGNOSIS — F17210 Nicotine dependence, cigarettes, uncomplicated: Secondary | ICD-10-CM | POA: Diagnosis not present

## 2023-09-19 DIAGNOSIS — J4489 Other specified chronic obstructive pulmonary disease: Secondary | ICD-10-CM | POA: Insufficient documentation

## 2023-09-19 MED ORDER — FAMOTIDINE 20 MG PO TABS
ORAL_TABLET | ORAL | 11 refills | Status: AC
Start: 2023-09-19 — End: ?

## 2023-09-19 MED ORDER — TRAMADOL HCL 50 MG PO TABS: 50.0000 mg | ORAL_TABLET | ORAL | 0 refills | Status: AC | PRN

## 2023-09-19 MED ORDER — PREDNISONE 10 MG PO TABS: ORAL_TABLET | ORAL | 0 refills | Status: DC

## 2023-09-19 MED ORDER — AZITHROMYCIN 250 MG PO TABS: ORAL_TABLET | ORAL | 0 refills | Status: DC

## 2023-09-19 NOTE — Assessment & Plan Note (Addendum)
 Active smoker > referred 09/19/2023 to [facility]   Low-dose CT lung cancer screening is recommended for patients who are 10-57 years of age with a 20+ pack-year history of smoking and who are currently smoking or quit <=15 years ago. No coughing up blood  No unintentional weight loss of > 15 pounds in the last 6 months - pt is eligible for scanning yearly until 13 y  after quits > referred   Counseled re importance of smoking cessation but did not meet time criteria for separate billing            Each maintenance medication was reviewed in detail including emphasizing most importantly the difference between maintenance and prns and under what circumstances the prns are to be triggered using an action plan format where appropriate.  Total time for H and P, chart review, counseling, reviewing hfa  device(s) and generating customized AVS unique to this office visit / same day charting = 45 min new pt eval

## 2023-09-19 NOTE — Assessment & Plan Note (Addendum)
 Active smoker - 09/19/2023  After extensive coaching inhaler device,  effectiveness =    0% (never inhaled anything at all)  - acute flare since 09/18/23  with mscp from coughing  >>>  rx zpak/ pred x 6 days/ gerd rx short term and tramadol  50 mg q 4 h prn    Clinically despite smoking she does not have much in terms of COPD at this point but rather intermittent bouts of AB  and unable to teach how to use hfa today  with DPIs likely to make cough worse as I suspect a component of UACS  Upper airway cough syndrome (previously labeled PNDS),  is so named because it's frequently impossible to sort out how much is  CR/sinusitis with freq throat clearing (which can be related to primary GERD)   vs  causing  secondary ( extra esophageal)  GERD from wide swings in gastric pressure that occur with throat clearing, often  promoting self use of mint and menthol lozenges that reduce the lower esophageal sphincter tone and exacerbate the problem further in a cyclical fashion.   These are the same pts (now being labeled as having irritable larynx syndrome by some cough centers) who not infrequently have a history of having failed to tolerate ace inhibitors,  dry powder inhalers or biphosphonates or report having atypical/extraesophageal reflux symptoms (LPR) that don't respond to standard doses of PPI  and are easily confused as having aecopd or asthma flares by even experienced allergists/ pulmonologists (myself included).   Rec; >>>Rx as above with tradmadol and gerd rx to stop cylical cough from UACS component >>> F/u in 6 weeks with all meds in hand using a trust but verify approach to confirm accurate Medication  Reconciliation The principal here is that until we are certain that the  patients are doing what we've asked, it makes no sense to ask them to do more.

## 2023-09-19 NOTE — Patient Instructions (Addendum)
 Please remember to go to the  x-ray department  @  [facility] for your tests - we will call you with the results when they are available     My office will be contacting you by phone for referral to lung cancer screening   [phone] - if you don't hear back from my office within one week,  please call us  back or notify us  thru MyChart and we'll address it right away.    For cough : Zpak   Prednisone  10 mg take  4 each am x 2 days,   2 each am x 2 days,  1 each am x 2 days and stop   For cough > Robitussin DM  (over the counter) and supplement with Tramadol  50 mg up to every 4 hours as needed for pain or cough   Omeprazole  20 mg   Take  30-60 min before first meal of the day and Pepcid  (famotidine )  20 mg after supper until return to office - this is the best way to tell whether stomach acid is contributing to your problem.    The key is to stop smoking completely before smoking completely stops you!     Please schedule a follow up office visit in 6 weeks, call sooner if needed with all medications /inhalers/ solutions in hand so we can verify exactly what you are taking. This includes all medications from all doctors and over the counters

## 2023-09-22 ENCOUNTER — Ambulatory Visit: Payer: Self-pay | Admitting: Internal Medicine

## 2023-09-24 NOTE — Progress Notes (Signed)
 Atc x1 lmtcb

## 2023-09-27 NOTE — Progress Notes (Signed)
 Spoke with pt and relayed results , pt confirmed understanding nfn

## 2023-10-02 ENCOUNTER — Other Ambulatory Visit: Payer: Self-pay | Admitting: Nurse Practitioner

## 2023-10-02 DIAGNOSIS — R232 Flushing: Secondary | ICD-10-CM

## 2023-10-08 ENCOUNTER — Encounter: Admitting: Nurse Practitioner

## 2023-10-11 ENCOUNTER — Encounter: Payer: Self-pay | Admitting: Family Medicine

## 2023-10-11 ENCOUNTER — Ambulatory Visit: Admitting: Family Medicine

## 2023-10-11 VITALS — BP 111/70 | HR 74 | Temp 98.4°F | Ht 59.0 in | Wt 96.4 lb

## 2023-10-11 DIAGNOSIS — M5441 Lumbago with sciatica, right side: Secondary | ICD-10-CM

## 2023-10-11 DIAGNOSIS — R7303 Prediabetes: Secondary | ICD-10-CM | POA: Diagnosis not present

## 2023-10-11 DIAGNOSIS — M5442 Lumbago with sciatica, left side: Secondary | ICD-10-CM

## 2023-10-11 DIAGNOSIS — I10 Essential (primary) hypertension: Secondary | ICD-10-CM | POA: Diagnosis not present

## 2023-10-11 DIAGNOSIS — E782 Mixed hyperlipidemia: Secondary | ICD-10-CM

## 2023-10-11 DIAGNOSIS — J4489 Other specified chronic obstructive pulmonary disease: Secondary | ICD-10-CM

## 2023-10-11 DIAGNOSIS — E559 Vitamin D deficiency, unspecified: Secondary | ICD-10-CM | POA: Diagnosis not present

## 2023-10-11 DIAGNOSIS — R232 Flushing: Secondary | ICD-10-CM

## 2023-10-11 DIAGNOSIS — Z23 Encounter for immunization: Secondary | ICD-10-CM

## 2023-10-11 LAB — BAYER DCA HB A1C WAIVED: HB A1C (BAYER DCA - WAIVED): 5.4 % (ref 4.8–5.6)

## 2023-10-11 LAB — CBC WITH DIFFERENTIAL/PLATELET

## 2023-10-11 MED ORDER — TIZANIDINE HCL 4 MG PO TABS: 4.0000 mg | ORAL_TABLET | Freq: Four times a day (QID) | ORAL | 0 refills | Status: DC | PRN

## 2023-10-11 NOTE — Progress Notes (Signed)
 Established Patient Office Visit  Subjective   Patient ID: Laura Golden, female    DOB: March 15, 1965  Age: 58 y.o. MRN: [id number]  Chief Complaint  Patient presents with   Medical Management of Chronic Issues    HPI  History of Present Illness   Laura Golden is a 58 year old female with hypertension who presents with back pain.  Low back pain - Onset at the end of September - Pain began upon getting out of bed and walking - Exacerbated by coughing or movement - No recent falls or injuries - Pain occasionally radiates down back of both thighs - No numbness, tingling, or incontinence - Tramadol  has not been effective for pain relief - Physical activity, such as walking, is limited due to back pain  Cough/bronchitis - Persistent cough - Established with pulmonology for management - Cough is exacerbated by continued tobacco use - Tramadol  has not provided relief for cough  Hypertension and hyperlipidemia - Hypertension managed with amlodipine  5 mg - Hyperlipidemia managed with pravastatin  - Diet includes chicken, burgers, and occasional salads, primarily eating at home  Vasomotor symptoms - On venlafaxine  for hot flashes - Continued sweating despite medication  Glycemic control - A1c levels are normal         ROS Negative unless specially indicated above in HPI.   Objective:     BP 111/70   Pulse 74   Temp 98.4 F (36.9 C) (Temporal)   Ht 4' 11 (1.499 m)   Wt 96 lb 6.4 oz (43.7 kg)   SpO2 98%   BMI 19.47 kg/m    Physical Exam Vitals and nursing note reviewed.  Constitutional:      General: She is not in acute distress.    Appearance: She is not ill-appearing, toxic-appearing or diaphoretic.  Cardiovascular:     Rate and Rhythm: Normal rate and regular rhythm.     Heart sounds: Normal heart sounds. No murmur heard. Pulmonary:     Effort: Pulmonary effort is normal.     Breath sounds: Normal air entry. No stridor. Decreased breath sounds present.  No wheezing, rhonchi or rales.  Musculoskeletal:     Cervical back: Neck supple. No rigidity.     Lumbar back: No swelling, edema, deformity, signs of trauma, lacerations, spasms or bony tenderness. Normal range of motion. No scoliosis.     Right lower leg: No edema.     Left lower leg: No edema.  Skin:    General: Skin is warm and dry.  Neurological:     Mental Status: She is alert and oriented to person, place, and time. Mental status is at baseline.  Psychiatric:        Mood and Affect: Mood normal.        Behavior: Behavior normal.      No results found for any visits on 10/11/23.    The 10-year ASCVD risk score (Arnett DK, et al., 2019) is: 4%    Assessment & Plan:   Laura Golden was seen today for medical management of chronic issues.  Diagnoses and all orders for this visit:  Prediabetes -     Bayer DCA Hb A1c Waived  Primary hypertension -     CBC with Differential/Platelet -     CMP14+EGFR -     TSH  Mixed hyperlipidemia -     Lipid panel  Vitamin D  deficiency -     VITAMIN D  25 Hydroxy (Vit-D Deficiency, Fractures)  Asthmatic bronchitis , chronic (HCC)  Hot  flashes  Acute midline low back pain with bilateral sciatica -     tiZANidine (ZANAFLEX) 4 MG tablet; Take 1 tablet (4 mg total) by mouth every 6 (six) hours as needed for muscle spasms.  Encounter for immunization -     Flu vaccine trivalent PF, 6mos and older(Flulaval,Afluria,Fluarix,Fluzone)      Acute low back pain with bilateral sciatica Denies red flag symptoms. Currently taking tramadol  per pulmonology and had recent prednisone  taper.  - Prescribed muscle relaxer, advised first dose in the evening to assess for drowsiness. - Advised taking muscle relaxer every six hours if needed and no drowsiness occurs. - Consider x-ray if no improvement.  Primary hypertension Hypertension managed with amlodipine  5 mg.  Mixed hyperlipidemia Mixed hyperlipidemia managed with  pravastatin .  Prediabetes A1c normal, indicating good control.  Hot flashes Flushing and hot flashes managed with venlafaxine . Veozah considered but not covered by Medicaid.       Return in about 6 months (around 04/10/2024) for CPE. Return to office for new or worsening symptoms, or if symptoms persist.    Laura Golden Search, FNP

## 2023-10-12 ENCOUNTER — Encounter: Payer: Self-pay | Admitting: *Deleted

## 2023-10-12 ENCOUNTER — Telehealth: Payer: Self-pay | Admitting: *Deleted

## 2023-10-12 LAB — CMP14+EGFR
ALT: 16 IU/L (ref 0–32)
AST: 18 IU/L (ref 0–40)
Albumin: 4.5 g/dL (ref 3.8–4.9)
Alkaline Phosphatase: 94 IU/L (ref 49–135)
BUN/Creatinine Ratio: 14 (ref 9–23)
BUN: 12 mg/dL (ref 6–24)
Bilirubin Total: 0.4 mg/dL (ref 0.0–1.2)
CO2: 24 mmol/L (ref 20–29)
Calcium: 9.6 mg/dL (ref 8.7–10.2)
Chloride: 104 mmol/L (ref 96–106)
Creatinine, Ser: 0.84 mg/dL (ref 0.57–1.00)
Globulin, Total: 2.1 g/dL (ref 1.5–4.5)
Glucose: 96 mg/dL (ref 70–99)
Potassium: 3.9 mmol/L (ref 3.5–5.2)
Sodium: 143 mmol/L (ref 134–144)
Total Protein: 6.6 g/dL (ref 6.0–8.5)
eGFR: 82 mL/min/1.73 (ref >59)

## 2023-10-12 LAB — CBC WITH DIFFERENTIAL/PLATELET
Basos: 1 %
EOS (ABSOLUTE): 0 x10E3/uL (ref 0.0–0.2)
Eos: 3 %
Hematocrit: 49 % — AB (ref 34.0–46.6)
Hemoglobin: 16 g/dL — AB (ref 11.1–15.9)
Immature Granulocytes: 0 %
Immature Granulocytes: 0 x10E3/uL (ref 0.0–0.1)
Lymphs: 61 %
MCH: 31.5 pg (ref 26.6–33.0)
MCHC: 32.7 g/dL (ref 31.5–35.7)
MCV: 97 fL (ref 79–97)
Monocytes Absolute: 0.2 x10E3/uL (ref 0.0–0.4)
Monocytes Absolute: 0.3 x10E3/uL (ref 0.1–0.9)
Monocytes: 6 %
Neutrophils Absolute: 1.5 x10E3/uL (ref 1.4–7.0)
Neutrophils Absolute: 3.2 x10E3/uL — AB (ref 0.7–3.1)
Neutrophils: 29 %
Platelets: 235 x10E3/uL (ref 150–450)
RBC: 5.08 x10E6/uL (ref 3.77–5.28)
RDW: 12.5 % (ref 11.7–15.4)
WBC: 5.2 x10E3/uL (ref 3.4–10.8)

## 2023-10-12 LAB — LIPID PANEL
Chol/HDL Ratio: 2.4 ratio (ref 0.0–4.4)
Cholesterol, Total: 150 mg/dL (ref 100–199)
HDL: 63 mg/dL (ref >39)
LDL Chol Calc (NIH): 73 mg/dL (ref 0–99)
Triglycerides: 72 mg/dL (ref 0–149)
VLDL Cholesterol Cal: 14 mg/dL (ref 5–40)

## 2023-10-12 LAB — TSH: TSH: 4.33 u[IU]/mL (ref 0.450–4.500)

## 2023-10-12 LAB — VITAMIN D 25 HYDROXY (VIT D DEFICIENCY, FRACTURES): Vit D, 25-Hydroxy: 27.2 ng/mL — AB (ref 30.0–100.0)

## 2023-10-12 NOTE — Patient Instructions (Signed)
 Gage Yacoub - I am sorry I was unable to reach you today for our scheduled appointment. I work with Joesph Annabella HERO, FNP and am calling to support your healthcare needs. Please contact me at [phone] at your earliest convenience. I look forward to speaking with you soon.   Thank you,  Rosina Forte, BSN RN [facility], [facility] RN Care Manager Direct Dial: [phone]  Fax: [phone]

## 2023-10-15 ENCOUNTER — Ambulatory Visit: Payer: Self-pay | Admitting: Family Medicine

## 2023-10-31 ENCOUNTER — Other Ambulatory Visit: Payer: Self-pay | Admitting: *Deleted

## 2023-10-31 NOTE — Patient Outreach (Signed)
 Complex Care Management   Visit Note  10/31/2023  Name:  Laura Golden MRN: [id number] DOB: [birth year]  Situation: Referral received for Complex Care Management related to HTN and HLD I obtained verbal consent from Patient.  Visit completed with Patient  on the phone  Background:   Past Medical History:  Diagnosis Date   Hypertension    Mixed hyperlipidemia 04/20/2020    Assessment: Patient Reported Symptoms:  Cognitive Cognitive Status: No symptoms reported Cognitive/Intellectual Conditions Management [RPT]: None reported or documented in medical history or problem list   Health Maintenance Behaviors: Annual physical exam Healing Pattern: Average Health Facilitated by: Rest  Neurological Neurological Review of Symptoms: No symptoms reported    HEENT HEENT Symptoms Reported: No symptoms reported      Cardiovascular Cardiovascular Symptoms Reported: No symptoms reported Does patient have uncontrolled Hypertension?: No Cardiovascular Management Strategies: Routine screening  Respiratory Respiratory Symptoms Reported: Productive cough Respiratory Management Strategies: Coping strategies Respiratory Self-Management Outcome: 4 (good)  Endocrine Endocrine Symptoms Reported: No symptoms reported Is patient diabetic?: No Endocrine Self-Management Outcome: 4 (good)  Gastrointestinal Gastrointestinal Symptoms Reported: No symptoms reported Gastrointestinal Self-Management Outcome: 4 (good)    Genitourinary Genitourinary Symptoms Reported: No symptoms reported    Integumentary Integumentary Symptoms Reported: No symptoms reported    Musculoskeletal Musculoskelatal Symptoms Reviewed: No symptoms reported   Falls in the past year?: Yes Number of falls in past year: 1 or less Was there an injury with Fall?: Yes Fall Risk Category Calculator: 2 Patient Fall Risk Level: Moderate Fall Risk Patient at Risk for Falls Due to: History of fall(s) Fall risk Follow up: Falls evaluation  completed  Psychosocial Psychosocial Symptoms Reported: No symptoms reported Behavioral Management Strategies: Coping strategies Behavioral Health Self-Management Outcome: 4 (good) Major Change/Loss/Stressor/Fears (CP): Denies Techniques to Cope with Loss/Stress/Change: Not applicable Quality of Family Relationships: helpful, involved, supportive Do you feel physically threatened by others?: No    10/31/2023    PHQ2-9 Depression Screening   Little interest or pleasure in doing things Not at all  Feeling down, depressed, or hopeless Not at all  PHQ-2 - Total Score 0  Trouble falling or staying asleep, or sleeping too much    Feeling tired or having little energy    Poor appetite or overeating     Feeling bad about yourself - or that you are a failure or have let yourself or your family down    Trouble concentrating on things, such as reading the newspaper or watching television    Moving or speaking so slowly that other people could have noticed.  Or the opposite - being so fidgety or restless that you have been moving around a lot more than usual    Thoughts that you would be better off dead, or hurting yourself in some way    PHQ2-9 Total Score    If you checked off any problems, how difficult have these problems made it for you to do your work, take care of things at home, or get along with other people    Depression Interventions/Treatment      There were no vitals filed for this visit.  Medications Reviewed Today     Reviewed by Bertrum Rosina HERO, RN (Registered Nurse) on 10/31/23 at 1057  Med List Status: <None>   Medication Order Taking? Sig Documenting Provider Last Dose Status Informant  acetaminophen (TYLENOL) 325 MG tablet [id number] Yes Take 325 mg by mouth every 6 (six) hours as needed for mild pain or headache. [provider]  Active Self  albuterol  (VENTOLIN  HFA) 108 (90 Base) MCG/ACT inhaler [id number] Yes Inhale 2 puffs into the lungs every 6 (six) hours as  needed for wheezing or shortness of breath. Gladis, Mary-Margaret, FNP  Active   amLODipine  (NORVASC ) 5 MG tablet [id number] Yes Take 1 tablet (5 mg total) by mouth daily. Cathlene Marry Lenis, FNP  Active   famotidine  (PEPCID ) 20 MG tablet [id number]  One after supper  Patient not taking: Reported on 10/31/2023   Wert, Michael B, MD  Active   fluticasone  (FLONASE ) 50 MCG/ACT nasal spray [id number] Yes Place 2 sprays into both nostrils daily. Cathlene Marry Lenis, FNP  Active   pravastatin  (PRAVACHOL ) 40 MG tablet [id number] Yes Take 1 tablet (40 mg total) by mouth every evening. Cathlene Marry Lenis, FNP  Active   tiZANidine (ZANAFLEX) 4 MG tablet [id number] Yes Take 1 tablet (4 mg total) by mouth every 6 (six) hours as needed for muscle spasms. Joesph Annabella HERO, FNP  Active   traMADol  (ULTRAM ) 50 MG tablet [id number]  Take 1 tablet (50 mg total) by mouth every 4 (four) hours as needed (severe cough).  Patient not taking: Reported on 10/31/2023   Wert, Michael B, MD  Active   triamcinolone  cream (KENALOG ) 0.1 % [id number] Yes Apply 1 application. topically 2 (two) times daily. Merlynn Eland F, FNP  Active   venlafaxine  XR (EFFEXOR -XR) 37.5 MG 24 hr capsule [id number] Yes TAKE 1 CAPSULE BY MOUTH EVERY MORNING WITH BREAKFAST Joesph Annabella HERO, FNP  Active             Recommendation:   Continue Current Plan of Care  Follow Up Plan:   Closing From:  Complex Care Management Patient has met all care management goals. Care Management case will be closed. Patient has been provided contact information should new needs arise.   Rosina Forte, BSN RN [facility], [facility] RN Care Manager Direct Dial: [phone]  Fax: [phone]

## 2023-10-31 NOTE — Patient Instructions (Signed)
 Visit Information  [name] was given information about Medicaid Managed Care team care coordination services as a part of their Healthy [facility] Medicaid benefit. Laura Golden   If you would like to schedule transportation through your [facility] plan, please call the following number at least 2 days in advance of your appointment: [phone]  For information about your ride after you set it up, call Ride Assist at [phone]. Use this number to activate a Will Call pickup, or if your transportation is late for a scheduled pickup. Use this number, too, if you need to make a change or cancel a previously scheduled reservation.  If you need transportation services right away, call [phone]. The after-hours call center is staffed 24 hours to handle ride assistance and urgent reservation requests (including discharges) 365 days a year. Urgent trips include sick visits, hospital discharge requests and life-sustaining treatment.  Call the [facility] Line at [phone], at any time, 24 hours a day, 7 days a week. If you are in danger or need immediate medical attention call 911.   Please see education materials related to HTN provided by MyChart link.  Patient verbalizes understanding of instructions and care plan provided today and agrees to view in MyChart. Active MyChart status and patient understanding of how to access instructions and care plan via MyChart confirmed with patient.     No further follow up required: Goals met.  Laura Golden, BSN RN [facility], [facility] RN Care Manager Direct Dial: [phone]  Fax: [phone]   Following is a copy of your plan of care:  There are no care plans that you recently modified to display for this patient.

## 2023-11-01 ENCOUNTER — Other Ambulatory Visit: Payer: Self-pay

## 2023-11-01 ENCOUNTER — Telehealth: Payer: Self-pay

## 2023-11-01 ENCOUNTER — Encounter: Payer: Self-pay | Admitting: Internal Medicine

## 2023-11-01 ENCOUNTER — Ambulatory Visit: Admitting: Internal Medicine

## 2023-11-01 ENCOUNTER — Telehealth: Payer: Self-pay | Admitting: Internal Medicine

## 2023-11-01 VITALS — BP 148/83 | HR 77 | Ht 59.0 in | Wt 96.4 lb

## 2023-11-01 DIAGNOSIS — J4489 Other specified chronic obstructive pulmonary disease: Secondary | ICD-10-CM

## 2023-11-01 DIAGNOSIS — J40 Bronchitis, not specified as acute or chronic: Secondary | ICD-10-CM

## 2023-11-01 DIAGNOSIS — F17210 Nicotine dependence, cigarettes, uncomplicated: Secondary | ICD-10-CM

## 2023-11-01 MED ORDER — PANTOPRAZOLE SODIUM 40 MG PO TBEC: 40.0000 mg | DELAYED_RELEASE_TABLET | Freq: Every day | ORAL | 2 refills | Status: AC

## 2023-11-01 MED ORDER — BUDESONIDE-FORMOTEROL FUMARATE 80-4.5 MCG/ACT IN AERO: INHALATION_SPRAY | RESPIRATORY_TRACT | 12 refills | Status: AC

## 2023-11-01 MED ORDER — BUDESONIDE-FORMOTEROL FUMARATE 80-4.5 MCG/ACT IN AERO: INHALATION_SPRAY | RESPIRATORY_TRACT | 12 refills | Status: DC

## 2023-11-01 NOTE — Telephone Encounter (Signed)
 Copied from CRM [id number]. Topic: Clinical - Request for Lab/Test Order >> Nov 01, 2023  2:35 PM Laura Golden wrote: Reason for CRM: Patient would like to leave a message for Dr. Darlean requesting to place an order for a lung screening at [facility].  Atc x1 to inform her that dr wert is calling to get someone to schedule her appt dor the ct and to have a pft on her next appt date

## 2023-11-01 NOTE — Progress Notes (Signed)
 [facility], female    DOB: 1965-03-16    MRN: [id number]   Brief patient profile:  7  yobf  active smoker  referred to pulmonary clinic in [place]  09/19/2023 by KANDICE Lenis NP [place]  for doe and cough x  2 years (with h/o remote ACEi cough)   Pt not previously seen by PCCM service.    History of Present Illness  09/19/2023  Pulmonary/ 1st office eval/ [facility] / [place] [facility]  Chief Complaint  Patient presents with   Establish Care   Cough    Shob at night and with activity - cough (Thick mucus - congestion )   Dyspnea:  [employer] across the parking lot / slower pace/ whole store  Cough: white mucus mostly daytime quite violent at times with gen bilateral cp just with hard cough  Sleep: flat bed  2 pillows and no noct or am  symptoms  SABA use: avg 1 / week not helping  but hfa very poor (see below)  02: none  LDSCT:referred today Rec Patient Instructions  Please remember to go to the  x-ray department  @  [facility] for your tests - we will call you with the results when they are available    My office will be contacting you by phone for referral to lung cancer screening   [phone]) - if you don't hear back from my office within one week,  please call us  back or notify us  thru MyChart and we'll address it right away.   For cough : Zpak  Prednisone  10 mg take  4 each am x 2 days,   2 each am x 2 days,  1 each am x 2 days and stop  For cough > Robitussin DM  (over the counter) and supplement with Tramadol  50 mg up to every 4 hours as needed for pain or cough  Omeprazole  20 mg   Take  30-60 min before first meal of the day and Pepcid  (famotidine )  20 mg after supper until return to office   The key is to stop smoking completely before smoking completely stops you!   Please schedule a follow up office visit in 6 weeks, call sooner if needed with all medications /inhalers/ solutions     11/01/2023  f/u ov/[place] office/Jolena Kittle re: AB maint on no rx    active  smoker did not bring meds  Chief Complaint  Patient presents with   Cough    White mucus - shob sometimes     Dyspnea:  only with ex  Cough: smoker's rattlel Sleeping:  flat bed/ 2 pillows s  resp cc  SABA use: rarely  02: none   Lung cancer screening: referred 09/19/23 but prefers be done at [facility]   No obvious day to day or daytime variability or assoc excess/ purulent sputum or mucus plugs or hemoptysis or cp or chest tightness, subjective wheeze or overt  hb symptoms.    Also denies any obvious fluctuation of symptoms with weather or environmental changes or other aggravating or alleviating factors except as outlined above   No unusual exposure hx or h/o childhood pna/ asthma or knowledge of premature birth.  Current Allergies, Complete Past Medical History, Past Surgical History, Family History, and Social History were reviewed in [employer] record.  ROS  The following are not active complaints unless bolded Hoarseness, sore throat, dysphagia, dental problems, itching, sneezing,  nasal congestion or discharge of excess mucus or purulent  secretions, ear ache,   fever, chills, sweats, unintended wt loss or wt gain, classically pleuritic or exertional cp,  orthopnea pnd or arm/hand swelling  or leg swelling, presyncope, palpitations, abdominal pain, anorexia, nausea, vomiting, diarrhea  or change in bowel habits or change in bladder habits, change in stools or change in urine, dysuria, hematuria,  rash, arthralgias, visual complaints, headache, numbness, weakness or ataxia or problems with walking or coordination,  change in mood or  memory.        Current Meds  Medication Sig   acetaminophen (TYLENOL) 325 MG tablet Take 325 mg by mouth every 6 (six) hours as needed for mild pain or headache.   amLODipine  (NORVASC ) 5 MG tablet Take 1 tablet (5 mg total) by mouth daily.   budesonide-formoterol (SYMBICORT) 80-4.5 MCG/ACT inhaler Take 2 puffs first thing in  am and then another 2 puffs about 12 hours later.   famotidine  (PEPCID ) 20 MG tablet One after supper   fluticasone  (FLONASE ) 50 MCG/ACT nasal spray Place 2 sprays into both nostrils daily.   pantoprazole (PROTONIX) 40 MG tablet Take 1 tablet (40 mg total) by mouth daily. Take 30-60 min before first meal of the day   pravastatin  (PRAVACHOL ) 40 MG tablet Take 1 tablet (40 mg total) by mouth every evening.   tiZANidine (ZANAFLEX) 4 MG tablet Take 1 tablet (4 mg total) by mouth every 6 (six) hours as needed for muscle spasms.   traMADol  (ULTRAM ) 50 MG tablet Take 1 tablet (50 mg total) by mouth every 4 (four) hours as needed (severe cough).   triamcinolone  cream (KENALOG ) 0.1 % Apply 1 application. topically 2 (two) times daily.   venlafaxine  XR (EFFEXOR -XR) 37.5 MG 24 hr capsule TAKE 1 CAPSULE BY MOUTH EVERY MORNING WITH BREAKFAST          Past Medical History:  Diagnosis Date   Hypertension    Mixed hyperlipidemia 04/20/2020      Objective:    Wts   11/01/2023      96   10/11/23 96 lb 6.4 oz (43.7 kg)  09/19/23 98 lb 3.2 oz (44.5 kg)  07/05/23 101 lb (45.8 kg)      Vital signs reviewed  11/01/2023  - Note at rest 02 sats  99% on RA   General appearance:    chronically ill apearing > stated age         HEENT : Oropharynx  clear/ not top teeth/ poor lower dentition    Nasal turbinates nl    NECK :  without  apparent JVD/ palpable Nodes/TM/ mild pseudowheeze    LUNGS: no acc muscle use,  Min barrel  contour chest wall with bilateral  faint exp  wheeze and  without cough on insp or exp maneuvers and min  Hyperresonant  to  percussion bilaterally    CV:  RRR  no s3 or murmur or increase in P2, and no edema   ABD:  soft and nontender    MS:  Nl gait/ ext warm without deformities Or obvious joint restrictions  calf tenderness, cyanosis or clubbing     SKIN: warm and dry without lesions    NEURO:  alert, approp, nl sensorium with  no motor or cerebellar deficits  apparent.            CXR PA and Lateral:   09/19/2023 :    I personally reviewed images and impression is as follows:     No acute findings     Assessment  Assessment & Plan Asthmatic bronchitis , chronic (HCC) Active smoker - acute flare sinc 09/18/23 rx zpak/ pred x 6 days/ hycodan and gerd rx  - 11/01/2023  After extensive coaching inhaler device,  effectiveness =   50 %  > spacer / symbicort 80 2bid due to large pw component  - PFT's ordered>>>  >>> train on spacer device as above and start on low dose symbicort pending return for pfts         Each maintenance medication was reviewed in detail including emphasizing most importantly the difference between maintenance and prns and under what circumstances the prns are to be triggered using an action plan format where appropriate.  Total time for H and P, chart review, counseling, reviewing hfa  device(s) and generating customized AVS unique to this office visit / same day charting = 32 min       Cigarette smoker  4  min discussion re active cigarette smoking in addition to office E&M  Ask about tobacco use:   ongoing Advise quitting   I took an extended  opportunity with this patient to outline the consequences of continued cigarette use  in airway disorders based on all the data we have from the multiple national lung health studies (perfomed over decades at millions of dollars in cost)  indicating that smoking cessation, not choice of inhalers or pulmonary physicians, is the most important aspect of her  care.   Assess willingness:  Not committed at this point Assist in quit attempt:  Per PCP when ready Arrange follow up:   Follow up per Primary Care planned         AVS  Patient Instructions  Plan A = Automatic = Always=    Symbicort 80 Take 2 puffs first thing in am and then another 2 puffs about 12 hours later.     Plan B = Backup (to supplement plan A, not to replace it) Use your albuterol  inhaler as a rescue  medication to be used if you can't catch your breath by resting or slowing your pace  or doing a relaxed purse lip breathing pattern.  - The less you use it, the better it will work when you need it. - Ok to use the inhaler up to 2 puffs  every 4 hours if you must but call for appointment if use goes up over your usual need - Don't leave home without it !!  (think of it like the spare tire or starter fluid for your car)   The key is to stop smoking completely before smoking completely stops you!   Please schedule a follow up office visit in 6 weeks, call sooner if needed with all respiratory medications /inhalers/ solutions in hand so we can verify exactly what you are taking. This includes all medications from all doctors and over the counters     Late add: pfts on return    Ozell America, MD 11/01/2023

## 2023-11-01 NOTE — Telephone Encounter (Signed)
 I messaged Laura Golden to call her to reshedule the LDCT at [facility]   She will need pfts on return

## 2023-11-01 NOTE — Assessment & Plan Note (Addendum)
 4   min discussion re active cigarette smoking in addition to office E&M  Ask about tobacco use:   ongoing  Advise quitting   I took an extended  opportunity with this patient to outline the consequences of continued cigarette use  in airway disorders based on all the data we have from the multiple national lung health studies (perfomed over decades at millions of dollars in cost)  indicating that smoking cessation, not choice of inhalers or pulmonary physicians, is the most important aspect of her  care.   Assess willingness:  Not committed at this point Assist in quit attempt:  Per PCP when ready Arrange follow up:   Follow up per Primary Care planned

## 2023-11-01 NOTE — Assessment & Plan Note (Addendum)
 Active smoker - acute flare sinc 09/18/23 rx zpak/ pred x 6 days/ hycodan and gerd rx  - 11/01/2023  After extensive coaching inhaler device,  effectiveness =   50 %  > spacer / symbicort 80 2bid due to large pw component  - PFT's ordered>>>  >>> train on spacer device as above and start on low dose symbicort pending return for pfts         Each maintenance medication was reviewed in detail including emphasizing most importantly the difference between maintenance and prns and under what circumstances the prns are to be triggered using an action plan format where appropriate.  Total time for H and P, chart review, counseling, reviewing hfa  device(s) and generating customized AVS unique to this office visit / same day charting = 32 min

## 2023-11-01 NOTE — Patient Instructions (Addendum)
 Plan A = Automatic = Always=    Symbicort 80 Take 2 puffs first thing in am and then another 2 puffs about 12 hours later.     Plan B = Backup (to supplement plan A, not to replace it) Use your albuterol  inhaler as a rescue medication to be used if you can't catch your breath by resting or slowing your pace  or doing a relaxed purse lip breathing pattern.  - The less you use it, the better it will work when you need it. - Ok to use the inhaler up to 2 puffs  every 4 hours if you must but call for appointment if use goes up over your usual need - Don't leave home without it !!  (think of it like the spare tire or starter fluid for your car)   The key is to stop smoking completely before smoking completely stops you!   Please schedule a follow up office visit in 6 weeks, call sooner if needed with all respiratory medications /inhalers/ solutions in hand so we can verify exactly what you are taking. This includes all medications from all doctors and over the counters

## 2023-11-01 NOTE — Telephone Encounter (Signed)
 Copied from CRM [id number]. Topic: Clinical - Prescription Issue >> Nov 01, 2023 11:42 AM Rozanna MATSU wrote: Reason for CRM: Dorise with [facility] stated the quantity is not correct for the budesonide-formoterol (SYMBICORT) 80-4.5 MCG/ACT inhaler [[id number]] it should be 10.2 grams. (Stated this is an on going issue) he also advised that the insurance will not pay for this one unless it is 10.2 grams. Stated will need new prescription  Resent correct rx to pharmacy

## 2023-11-02 ENCOUNTER — Other Ambulatory Visit: Payer: Self-pay | Admitting: *Deleted

## 2023-11-02 ENCOUNTER — Telehealth: Payer: Self-pay | Admitting: *Deleted

## 2023-11-02 DIAGNOSIS — Z87891 Personal history of nicotine dependence: Secondary | ICD-10-CM

## 2023-11-02 DIAGNOSIS — Z122 Encounter for screening for malignant neoplasm of respiratory organs: Secondary | ICD-10-CM

## 2023-11-02 DIAGNOSIS — F17210 Nicotine dependence, cigarettes, uncomplicated: Secondary | ICD-10-CM

## 2023-11-02 NOTE — Telephone Encounter (Signed)
 Lung Cancer Screening Narrative/Criteria Questionnaire (Cigarette Smokers Only- No Cigars/Pipes/vapes)   Laura Golden   SDMV:11/14/23 9:00- [place]                                           [birth year]              LDCT: 11/20/23 10:30- AP    57 y.o.   Phone: [phone]  Lung Screening Narrative (confirm age 42-77 yrs Medicare / 50-80 yrs Private pay insurance)   Insurance information:Healthy Blue   Referring Provider:Wert   This screening involves an initial phone call with a team member from our program. It is called a shared decision making visit. The initial meeting is required by insurance and Medicare to make sure you understand the program. This appointment takes about 15-20 minutes to complete. The CT scan will completed at a separate date/time. This scan takes about 5-10 minutes to complete and you may eat and drink before and after the scan.  Criteria questions for Lung Cancer Screening:   Are you a current or former smoker? Current Age began smoking: 15   If you are a former smoker, what year did you quit smoking? (within 15 yrs)   To calculate your smoking history, I need an accurate estimate of how many packs of cigarettes you smoked per day and for how many years. (Not just the number of PPD you are now smoking)   Years smoking 42 x Packs per day 1/4 - 1 = Pack years 30   (at least 20 pack yrs)   (Make sure they understand that we need to know how much they have smoked in the past, not just the number of PPD they are smoking now)  Do you have a personal history of cancer?  No    Do you have a family history of cancer? Yes  (cancer type and and relative) sister (breast)  Are you coughing up blood?  No  Have you had unexplained weight loss of 15 lbs or more in the last 6 months? No  It looks like you meet all criteria.     Additional information: N/A

## 2023-11-06 ENCOUNTER — Other Ambulatory Visit: Payer: Self-pay | Admitting: Family Medicine

## 2023-11-06 DIAGNOSIS — R232 Flushing: Secondary | ICD-10-CM

## 2023-11-07 ENCOUNTER — Telehealth: Payer: Self-pay | Admitting: Internal Medicine

## 2023-11-07 NOTE — Telephone Encounter (Signed)
 Spoke with patient regarding the Tuesday 01/22/24 11:00 am PFT appointment at [facility] time is 10:45 am--1st floor registration desk for check in---will mail information / instructions to patient and she voiced her understanding

## 2023-11-14 ENCOUNTER — Encounter: Payer: Self-pay | Admitting: Adult Health

## 2023-11-14 ENCOUNTER — Ambulatory Visit: Admitting: Adult Health

## 2023-11-14 DIAGNOSIS — F17210 Nicotine dependence, cigarettes, uncomplicated: Secondary | ICD-10-CM

## 2023-11-14 NOTE — Progress Notes (Signed)
  Virtual Visit via Telephone Note  I connected with [facility] , 11/14/23 9:08 AM by a telemedicine application and verified that I am speaking with the correct person using two identifiers.  Location: Patient: home Provider: home   I discussed the limitations of evaluation and management by telemedicine and the availability of in person appointments. The patient expressed understanding and agreed to proceed.   Shared Decision Making Visit Lung Cancer Screening Program [phone])   Eligibility: 58 y.o. Pack Years Smoking History Calculation = 30 pack years  (# packs/per year x # years smoked) Recent History of coughing up blood  no Unexplained weight loss? no ( >Than 15 pounds within the last 6 months ) Prior History Lung / other cancer no (Diagnosis within the last 5 years already requiring surveillance chest CT Scans). Smoking Status Current Smoker  Visit Components: Discussion included one or more decision making aids. YES Discussion included risk/benefits of screening. YES Discussion included potential follow up diagnostic testing for abnormal scans. YES Discussion included meaning and risk of over diagnosis. YES Discussion included meaning and risk of False Positives. YES Discussion included meaning of total radiation exposure. YES  Counseling Included: Importance of adherence to annual lung cancer LDCT screening. YES Impact of comorbidities on ability to participate in the program. YES Ability and willingness to under diagnostic treatment. YES  Smoking Cessation Counseling: Current Smokers:  Discussed importance of smoking cessation. yes Information about tobacco cessation classes and interventions provided to patient. yes Patient provided with ticket for LDCT Scan. yes Symptomatic Patient. NO Diagnosis Code: Tobacco Use Z72.0 Asymptomatic Patient yes  Counseling - 4 minutes of smoking cessation counseling (CT Chest Lung Cancer Screening Low Dose W/O CM)  [id number]  Z12.2-Screening of respiratory organs Z87.891-Personal history of nicotine dependence   Laura Golden 11/14/23

## 2023-11-14 NOTE — Patient Instructions (Signed)

## 2023-11-19 ENCOUNTER — Other Ambulatory Visit: Payer: Self-pay | Admitting: Family Medicine

## 2023-11-19 DIAGNOSIS — M5441 Lumbago with sciatica, right side: Secondary | ICD-10-CM

## 2023-11-19 NOTE — Telephone Encounter (Unsigned)
 Copied from CRM [id number]. Topic: Clinical - Medication Refill >> Nov 19, 2023  3:08 PM Mia F wrote: Medication: tiZANidine (ZANAFLEX) 4 MG tablet   Has the patient contacted their pharmacy? Yes (Agent: If no, request that the patient contact the pharmacy for the refill. If patient does not wish to contact the pharmacy document the reason why and proceed with request.) (Agent: If yes, when and what did the pharmacy advise?)  This is the patient's preferred pharmacy:  [facility], [place] - [address] [place] [address] Phone: [phone] Fax: [phone]  Is this the correct pharmacy for this prescription? Yes If no, delete pharmacy and type the correct one.   Has the prescription been filled recently? No  Is the patient out of the medication? Yes  Has the patient been seen for an appointment in the last year OR does the patient have an upcoming appointment? Yes  Can we respond through MyChart? Yes  Agent: Please be advised that Rx refills may take up to 3 business days. We ask that you follow-up with your pharmacy.

## 2023-11-21 ENCOUNTER — Ambulatory Visit (HOSPITAL_COMMUNITY)
Admission: RE | Admit: 2023-11-21 | Discharge: 2023-11-21 | Disposition: A | Discharge status: Home / Self Care | Source: Ambulatory Visit | Attending: Acute Care | Admitting: Acute Care

## 2023-11-21 DIAGNOSIS — F17210 Nicotine dependence, cigarettes, uncomplicated: Secondary | ICD-10-CM | POA: Insufficient documentation

## 2023-11-21 DIAGNOSIS — Z122 Encounter for screening for malignant neoplasm of respiratory organs: Secondary | ICD-10-CM | POA: Insufficient documentation

## 2023-11-21 DIAGNOSIS — Z87891 Personal history of nicotine dependence: Secondary | ICD-10-CM | POA: Insufficient documentation

## 2023-11-21 MED ORDER — TIZANIDINE HCL 4 MG PO TABS: 4.0000 mg | ORAL_TABLET | Freq: Four times a day (QID) | ORAL | 0 refills | Status: AC | PRN

## 2023-11-28 ENCOUNTER — Other Ambulatory Visit: Payer: Self-pay

## 2023-11-28 DIAGNOSIS — Z122 Encounter for screening for malignant neoplasm of respiratory organs: Secondary | ICD-10-CM

## 2023-11-28 DIAGNOSIS — Z87891 Personal history of nicotine dependence: Secondary | ICD-10-CM

## 2023-11-28 DIAGNOSIS — F17210 Nicotine dependence, cigarettes, uncomplicated: Secondary | ICD-10-CM

## 2023-12-13 ENCOUNTER — Ambulatory Visit: Admitting: Internal Medicine

## 2023-12-24 ENCOUNTER — Other Ambulatory Visit: Payer: Self-pay | Admitting: *Deleted

## 2023-12-24 DIAGNOSIS — E782 Mixed hyperlipidemia: Secondary | ICD-10-CM

## 2023-12-24 MED ORDER — PRAVASTATIN SODIUM 40 MG PO TABS: 40.0000 mg | ORAL_TABLET | Freq: Every evening | ORAL | 1 refills | Status: AC

## 2024-01-22 ENCOUNTER — Ambulatory Visit (HOSPITAL_COMMUNITY): Admission: RE | Admit: 2024-01-22 | Source: Ambulatory Visit

## 2024-01-23 ENCOUNTER — Ambulatory Visit: Admitting: Internal Medicine

## 2024-01-23 DIAGNOSIS — J4489 Other specified chronic obstructive pulmonary disease: Secondary | ICD-10-CM

## 2024-01-23 NOTE — Progress Notes (Unsigned)
 [address] [place], female    DOB: 1965-05-12    MRN: [id number]   Brief patient profile:  86  yobf  active smoker  referred to pulmonary clinic in [place]  09/19/2023 by KANDICE Lenis NP [place]  for doe and cough x  2 years (with h/o remote ACEi cough)   Pt not previously seen by PCCM service.    History of Present Illness  09/19/2023  Pulmonary/ 1st office eval/ Sher Shampine / [place] [facility]  Chief Complaint  Patient presents with   Establish Care   Cough    Shob at night and with activity - cough (Thick mucus - congestion )   Dyspnea:  [employer] across the parking lot / slower pace/ whole store  Cough: white mucus mostly daytime quite violent at times with gen bilateral cp just with hard cough  Sleep: flat bed  2 pillows and no noct or am  symptoms  SABA use: avg 1 / week not helping  but hfa very poor (see below)  02: none  LDSCT:referred today Rec Patient Instructions  Please remember to go to the  x-ray department  @  [facility] for your tests - we will call you with the results when they are available    My office will be contacting you by phone for referral to lung cancer screening   [phone] - if you don't hear back from my office within one week,  please call us  back or notify us  thru MyChart and we'll address it right away.   For cough : Zpak  Prednisone  10 mg take  4 each am x 2 days,   2 each am x 2 days,  1 each am x 2 days and stop  For cough > Robitussin DM  (over the counter) and supplement with Tramadol  50 mg up to every 4 hours as needed for pain or cough  Omeprazole  20 mg   Take  30-60 min before first meal of the day and Pepcid  (famotidine )  20 mg after supper until return to office   The key is to stop smoking completely before smoking completely stops you!   Please schedule a follow up office visit in 6 weeks, call sooner if needed with all medications /inhalers/ solutions     11/01/2023  f/u ov/[place] [facility]/Tijah Hane re: AB maint on no rx    active  smoker did not bring meds  Chief Complaint  Patient presents with   Cough    White mucus - shob sometimes     Dyspnea:  only with ex  Cough: smoker's rattlel Sleeping:  flat bed/ 2 pillows s  resp cc  SABA use: rarely  02: none  Lung cancer screening: referred 09/19/23 but prefers be done at [facility]  Patient Instructions  Plan A = Automatic = Always=    Symbicort  80 Take 2 puffs first thing in am and then another 2 puffs about 12 hours later.  Plan B = Backup (to supplement plan A, not to replace it) Use your albuterol  inhaler as a rescue medication   The key is to stop smoking completely before smoking completely stops you! Please schedule a follow up office visit in 6 weeks, call sooner if needed with all respiratory medications /inhalers/ solutions in hand s   Late add: pfts on return    01/23/2024  f/u ov/[place] office/Kristain Hu re: AB/ smoker  maint on *** did *** bring meds  No chief complaint on file.  Dyspnea:  ***  Cough: *** Sleeping: ***   resp cc  SABA use: *** 02: *** Lung cancer screening: ***  No obvious day to day or daytime variability or assoc excess/ purulent sputum or mucus plugs or hemoptysis or cp or chest tightness, subjective wheeze or overt sinus or hb symptoms.    Also denies any obvious fluctuation of symptoms with weather or environmental changes or other aggravating or alleviating factors except as outlined above   No unusual exposure hx or h/o childhood pna/ asthma or knowledge of premature birth.  Current Allergies, Complete Past Medical History, Past Surgical History, Family History, and Social History were reviewed in [employer] record.  ROS  The following are not active complaints unless bolded Hoarseness, sore throat, dysphagia, dental problems, itching, sneezing,  nasal congestion or discharge of excess mucus or purulent secretions, ear ache,   fever, chills, sweats, unintended wt loss or wt gain, classically  pleuritic or exertional cp,  orthopnea pnd or arm/hand swelling  or leg swelling, presyncope, palpitations, abdominal pain, anorexia, nausea, vomiting, diarrhea  or change in bowel habits or change in bladder habits, change in stools or change in urine, dysuria, hematuria,  rash, arthralgias, visual complaints, headache, numbness, weakness or ataxia or problems with walking or coordination,  change in mood or  memory.         Outpatient Medications Prior to Visit  Medication Sig Dispense Refill   acetaminophen (TYLENOL) 325 MG tablet Take 325 mg by mouth every 6 (six) hours as needed for mild pain or headache.     albuterol  (VENTOLIN  HFA) 108 (90 Base) MCG/ACT inhaler Inhale 2 puffs into the lungs every 6 (six) hours as needed for wheezing or shortness of breath. (Patient not taking: Reported on 11/01/2023) 8 g 0   amLODipine  (NORVASC ) 5 MG tablet Take 1 tablet (5 mg total) by mouth daily. 90 tablet 0   budesonide -formoterol  (SYMBICORT ) 80-4.5 MCG/ACT inhaler Take 2 puffs first thing in am and then another 2 puffs about 12 hours later. 10.2 g 12   famotidine  (PEPCID ) 20 MG tablet One after supper 30 tablet 11   fluticasone  (FLONASE ) 50 MCG/ACT nasal spray Place 2 sprays into both nostrils daily. 16 g 6   pantoprazole  (PROTONIX ) 40 MG tablet Take 1 tablet (40 mg total) by mouth daily. Take 30-60 min before first meal of the day 30 tablet 2   pravastatin  (PRAVACHOL ) 40 MG tablet Take 1 tablet (40 mg total) by mouth every evening. 90 tablet 1   tiZANidine  (ZANAFLEX ) 4 MG tablet Take 1 tablet (4 mg total) by mouth every 6 (six) hours as needed for muscle spasms. 30 tablet 0   traMADol  (ULTRAM ) 50 MG tablet Take 1 tablet (50 mg total) by mouth every 4 (four) hours as needed (severe cough). 40 tablet 0   triamcinolone  cream (KENALOG ) 0.1 % Apply 1 application. topically 2 (two) times daily. 80 g 1   venlafaxine  XR (EFFEXOR -XR) 37.5 MG 24 hr capsule TAKE 1 CAPSULE BY MOUTH EVERY MORNING WITH BREAKFAST 30  capsule 5   No facility-administered medications prior to visit.         Past Medical History:  Diagnosis Date   Hypertension    Mixed hyperlipidemia 04/20/2020      Objective:    Wts  01/23/2024         ***  11/01/2023      96   10/11/23 96 lb 6.4 oz (43.7 kg)  09/19/23 98 lb 3.2 oz (44.5 kg)  07/05/23 101 lb (45.8 kg)      Vital signs reviewed  01/23/2024  - Note at rest 02 sats  ***% on ***   General appearance:    ***    Min barr***    Assessment                         "

## 2024-01-29 ENCOUNTER — Other Ambulatory Visit: Payer: Self-pay | Admitting: Family Medicine

## 2024-01-29 DIAGNOSIS — I10 Essential (primary) hypertension: Secondary | ICD-10-CM

## 2024-01-29 DIAGNOSIS — R232 Flushing: Secondary | ICD-10-CM

## 2024-01-29 MED ORDER — AMLODIPINE BESYLATE 5 MG PO TABS: 5.0000 mg | ORAL_TABLET | Freq: Every day | ORAL | 0 refills | Status: AC

## 2024-01-29 NOTE — Telephone Encounter (Signed)
 Informed her that Amlodipine  has been sent to pharmacy. Double checked w/ the pharmacy they have her Effexor  on file w/ refills which was sent in on 11/07/23.

## 2024-01-29 NOTE — Telephone Encounter (Signed)
 Copied from CRM [id number]. Topic: Clinical - Medication Refill >> Jan 29, 2024 11:20 AM Emylou G wrote: Medication: venlafaxine  XR (EFFEXOR -XR) 37.5 MG 24 hr capsule amLODipine  (NORVASC ) 5 MG tablet   Has the patient contacted their pharmacy? No (Agent: If no, request that the patient contact the pharmacy for the refill. If patient does not wish to contact the pharmacy document the reason why and proceed with request.) (Agent: If yes, when and what did the pharmacy advise?)  This is the patient's preferred pharmacy:  [facility], [place] - [address] [place] [address] Phone: [phone] Fax: [phone]  Is this the correct pharmacy for this prescription? Yes If no, delete pharmacy and type the correct one.   Has the prescription been filled recently? No  Is the patient out of the medication? Yes  Has the patient been seen for an appointment in the last year OR does the patient have an upcoming appointment? Yes  Can we respond through MyChart? No  Agent: Please be advised that Rx refills may take up to 3 business days. We ask that you follow-up with your pharmacy.

## 2024-03-18 ENCOUNTER — Encounter (HOSPITAL_COMMUNITY)

## 2024-03-24 ENCOUNTER — Ambulatory Visit: Admitting: Internal Medicine

## 2024-05-02 ENCOUNTER — Encounter: Payer: Self-pay | Admitting: Family Medicine
